# Patient Record
Sex: Male | Born: 1950 | Race: Black or African American | Hispanic: No | Marital: Married | Smoking: Never smoker
Health system: Southern US, Community
[De-identification: ages and names within clinical notes are randomized; demographics above are authoritative.]

## PROBLEM LIST (undated history)

## (undated) DIAGNOSIS — G4752 REM sleep behavior disorder: Secondary | ICD-10-CM

## (undated) DIAGNOSIS — N200 Calculus of kidney: Secondary | ICD-10-CM

## (undated) DIAGNOSIS — G3184 Mild cognitive impairment, so stated: Secondary | ICD-10-CM

## (undated) DIAGNOSIS — M545 Low back pain, unspecified: Secondary | ICD-10-CM

## (undated) DIAGNOSIS — G20 Parkinson's disease: Secondary | ICD-10-CM

## (undated) DIAGNOSIS — F329 Major depressive disorder, single episode, unspecified: Secondary | ICD-10-CM

## (undated) DIAGNOSIS — M1990 Unspecified osteoarthritis, unspecified site: Secondary | ICD-10-CM

## (undated) DIAGNOSIS — R413 Other amnesia: Secondary | ICD-10-CM

## (undated) DIAGNOSIS — E785 Hyperlipidemia, unspecified: Secondary | ICD-10-CM

## (undated) DIAGNOSIS — G20A1 Parkinson's disease without dyskinesia, without mention of fluctuations: Secondary | ICD-10-CM

## (undated) DIAGNOSIS — I10 Essential (primary) hypertension: Secondary | ICD-10-CM

## (undated) DIAGNOSIS — F419 Anxiety disorder, unspecified: Secondary | ICD-10-CM

## (undated) DIAGNOSIS — F32A Depression, unspecified: Secondary | ICD-10-CM

## (undated) DIAGNOSIS — Z87442 Personal history of urinary calculi: Secondary | ICD-10-CM

## (undated) DIAGNOSIS — N201 Calculus of ureter: Secondary | ICD-10-CM

## (undated) DIAGNOSIS — E669 Obesity, unspecified: Secondary | ICD-10-CM

## (undated) DIAGNOSIS — G8929 Other chronic pain: Secondary | ICD-10-CM

## (undated) DIAGNOSIS — E119 Type 2 diabetes mellitus without complications: Secondary | ICD-10-CM

## (undated) DIAGNOSIS — R269 Unspecified abnormalities of gait and mobility: Secondary | ICD-10-CM

## (undated) HISTORY — DX: Low back pain: M54.5

## (undated) HISTORY — DX: REM sleep behavior disorder: G47.52

## (undated) HISTORY — DX: Low back pain, unspecified: M54.50

## (undated) HISTORY — DX: Depression, unspecified: F32.A

## (undated) HISTORY — PX: OTHER SURGICAL HISTORY: SHX169

## (undated) HISTORY — DX: Major depressive disorder, single episode, unspecified: F32.9

## (undated) HISTORY — DX: Other chronic pain: G89.29

## (undated) HISTORY — DX: Anxiety disorder, unspecified: F41.9

## (undated) HISTORY — DX: Calculus of kidney: N20.0

## (undated) HISTORY — DX: Obesity, unspecified: E66.9

## (undated) HISTORY — DX: Other amnesia: R41.3

## (undated) HISTORY — DX: Unspecified abnormalities of gait and mobility: R26.9

---

## 2006-07-18 ENCOUNTER — Ambulatory Visit: Payer: Self-pay | Admitting: Family Medicine

## 2006-07-24 ENCOUNTER — Ambulatory Visit: Payer: Self-pay | Admitting: Family Medicine

## 2006-08-01 ENCOUNTER — Ambulatory Visit: Payer: Self-pay | Admitting: Family Medicine

## 2008-12-12 ENCOUNTER — Ambulatory Visit: Payer: Self-pay | Admitting: Internal Medicine

## 2008-12-12 DIAGNOSIS — F3289 Other specified depressive episodes: Secondary | ICD-10-CM | POA: Insufficient documentation

## 2008-12-12 DIAGNOSIS — M545 Low back pain: Secondary | ICD-10-CM

## 2008-12-12 DIAGNOSIS — Z87442 Personal history of urinary calculi: Secondary | ICD-10-CM

## 2008-12-12 DIAGNOSIS — F329 Major depressive disorder, single episode, unspecified: Secondary | ICD-10-CM

## 2008-12-12 LAB — CONVERTED CEMR LAB
AST: 26 units/L (ref 0–37)
Albumin: 4 g/dL (ref 3.5–5.2)
Alkaline Phosphatase: 63 units/L (ref 39–117)
BUN: 13 mg/dL (ref 6–23)
Bilirubin, Direct: 0.1 mg/dL (ref 0.0–0.3)
Chloride: 106 meq/L (ref 96–112)
Cholesterol: 216 mg/dL (ref 0–200)
Eosinophils Relative: 3.9 % (ref 0.0–5.0)
GFR calc Af Amer: 88 mL/min
GFR calc non Af Amer: 73 mL/min
Glucose, Bld: 96 mg/dL (ref 70–99)
HCT: 44 % (ref 39.0–52.0)
HDL: 35.8 mg/dL — ABNORMAL LOW (ref >39.0)
Monocytes Absolute: 0.6 10*3/uL (ref 0.1–1.0)
Monocytes Relative: 11.8 % (ref 3.0–12.0)
Neutrophils Relative %: 53.2 % (ref 43.0–77.0)
Platelets: 190 10*3/uL (ref 150–400)
Potassium: 4.1 meq/L (ref 3.5–5.1)
RDW: 13.6 % (ref 11.5–14.6)
Sodium: 142 meq/L (ref 135–145)
TSH: 1.73 microintl units/mL (ref 0.35–5.50)
Total CHOL/HDL Ratio: 6
Total Protein: 7.6 g/dL (ref 6.0–8.3)
Triglycerides: 104 mg/dL (ref 0–149)
VLDL: 21 mg/dL (ref 0–40)
WBC: 5 10*3/uL (ref 4.5–10.5)

## 2008-12-26 ENCOUNTER — Telehealth: Payer: Self-pay | Admitting: Internal Medicine

## 2008-12-29 ENCOUNTER — Telehealth: Payer: Self-pay | Admitting: Internal Medicine

## 2009-03-04 ENCOUNTER — Ambulatory Visit: Payer: Self-pay | Admitting: Internal Medicine

## 2009-03-04 DIAGNOSIS — R062 Wheezing: Secondary | ICD-10-CM

## 2009-04-22 ENCOUNTER — Encounter: Admission: RE | Admit: 2009-04-22 | Discharge: 2009-04-22 | Payer: Self-pay | Admitting: Family Medicine

## 2009-05-06 ENCOUNTER — Ambulatory Visit: Payer: Self-pay | Admitting: Internal Medicine

## 2012-02-14 ENCOUNTER — Ambulatory Visit (HOSPITAL_COMMUNITY)
Admission: RE | Admit: 2012-02-14 | Discharge: 2012-02-14 | Disposition: A | Discharge status: Home / Self Care | Payer: BC Managed Care – PPO | Source: Ambulatory Visit | Attending: Chiropractic Medicine | Admitting: Chiropractic Medicine

## 2012-02-14 ENCOUNTER — Other Ambulatory Visit (HOSPITAL_COMMUNITY): Payer: Self-pay | Admitting: Chiropractic Medicine

## 2012-02-14 DIAGNOSIS — M5146 Schmorl's nodes, lumbar region: Secondary | ICD-10-CM | POA: Insufficient documentation

## 2012-02-14 DIAGNOSIS — M47817 Spondylosis without myelopathy or radiculopathy, lumbosacral region: Secondary | ICD-10-CM | POA: Insufficient documentation

## 2012-02-14 DIAGNOSIS — M545 Low back pain: Secondary | ICD-10-CM

## 2012-05-08 ENCOUNTER — Other Ambulatory Visit: Payer: Self-pay | Admitting: Family Medicine

## 2012-05-08 DIAGNOSIS — R5381 Other malaise: Secondary | ICD-10-CM

## 2012-05-09 ENCOUNTER — Other Ambulatory Visit: Payer: BC Managed Care – PPO

## 2012-06-06 ENCOUNTER — Ambulatory Visit
Admission: RE | Admit: 2012-06-06 | Discharge: 2012-06-06 | Disposition: A | Discharge status: Home / Self Care | Payer: BC Managed Care – PPO | Source: Ambulatory Visit | Attending: Family Medicine | Admitting: Family Medicine

## 2012-06-06 DIAGNOSIS — R5383 Other fatigue: Secondary | ICD-10-CM

## 2012-06-06 MED ORDER — IOHEXOL 300 MG/ML  SOLN
75.0000 mL | Freq: Once | INTRAMUSCULAR | Status: AC | PRN
  Administered 2012-06-06: 75 mL via INTRAVENOUS

## 2013-07-08 ENCOUNTER — Encounter (HOSPITAL_COMMUNITY): Payer: Self-pay | Admitting: *Deleted

## 2013-07-08 ENCOUNTER — Emergency Department (HOSPITAL_COMMUNITY): Payer: BC Managed Care – PPO

## 2013-07-08 ENCOUNTER — Emergency Department (HOSPITAL_COMMUNITY)
Admission: EM | Admit: 2013-07-08 | Discharge: 2013-07-08 | Disposition: A | Discharge status: Home / Self Care | Payer: BC Managed Care – PPO | Attending: Emergency Medicine | Admitting: Emergency Medicine

## 2013-07-08 DIAGNOSIS — M545 Low back pain, unspecified: Secondary | ICD-10-CM | POA: Insufficient documentation

## 2013-07-08 DIAGNOSIS — G20A1 Parkinson's disease without dyskinesia, without mention of fluctuations: Secondary | ICD-10-CM | POA: Insufficient documentation

## 2013-07-08 DIAGNOSIS — Z79899 Other long term (current) drug therapy: Secondary | ICD-10-CM | POA: Insufficient documentation

## 2013-07-08 DIAGNOSIS — E119 Type 2 diabetes mellitus without complications: Secondary | ICD-10-CM | POA: Insufficient documentation

## 2013-07-08 DIAGNOSIS — E7800 Pure hypercholesterolemia, unspecified: Secondary | ICD-10-CM | POA: Insufficient documentation

## 2013-07-08 DIAGNOSIS — N200 Calculus of kidney: Secondary | ICD-10-CM | POA: Insufficient documentation

## 2013-07-08 DIAGNOSIS — G20 Parkinson's disease: Secondary | ICD-10-CM | POA: Insufficient documentation

## 2013-07-08 DIAGNOSIS — R110 Nausea: Secondary | ICD-10-CM | POA: Insufficient documentation

## 2013-07-08 DIAGNOSIS — I10 Essential (primary) hypertension: Secondary | ICD-10-CM | POA: Insufficient documentation

## 2013-07-08 HISTORY — DX: Essential (primary) hypertension: I10

## 2013-07-08 HISTORY — DX: Parkinson's disease without dyskinesia, without mention of fluctuations: G20.A1

## 2013-07-08 HISTORY — DX: Parkinson's disease: G20

## 2013-07-08 LAB — CBC WITH DIFFERENTIAL/PLATELET
Basophils Relative: 0 % (ref 0–1)
HCT: 43.4 % (ref 39.0–52.0)
Hemoglobin: 14.8 g/dL (ref 13.0–17.0)
Lymphocytes Relative: 8 % — ABNORMAL LOW (ref 12–46)
Lymphs Abs: 0.9 10*3/uL (ref 0.7–4.0)
Monocytes Absolute: 0.5 10*3/uL (ref 0.1–1.0)
Monocytes Relative: 5 % (ref 3–12)
Neutro Abs: 9.3 10*3/uL — ABNORMAL HIGH (ref 1.7–7.7)
Neutrophils Relative %: 87 % — ABNORMAL HIGH (ref 43–77)
RBC: 4.99 MIL/uL (ref 4.22–5.81)
WBC: 10.7 10*3/uL — ABNORMAL HIGH (ref 4.0–10.5)

## 2013-07-08 LAB — COMPREHENSIVE METABOLIC PANEL
Albumin: 4.2 g/dL (ref 3.5–5.2)
Alkaline Phosphatase: 64 U/L (ref 39–117)
BUN: 14 mg/dL (ref 6–23)
CO2: 24 mEq/L (ref 19–32)
Chloride: 104 mEq/L (ref 96–112)
GFR calc non Af Amer: 45 mL/min — ABNORMAL LOW (ref >90)
Glucose, Bld: 192 mg/dL — ABNORMAL HIGH (ref 70–99)
Potassium: 4.2 mEq/L (ref 3.5–5.1)
Total Bilirubin: 0.4 mg/dL (ref 0.3–1.2)

## 2013-07-08 LAB — URINALYSIS, ROUTINE W REFLEX MICROSCOPIC
Glucose, UA: NEGATIVE mg/dL
Leukocytes, UA: NEGATIVE
Nitrite: NEGATIVE
Specific Gravity, Urine: 1.03 (ref 1.005–1.030)
pH: 6 (ref 5.0–8.0)

## 2013-07-08 LAB — LIPASE, BLOOD: Lipase: 14 U/L (ref 11–59)

## 2013-07-08 MED ORDER — PROMETHAZINE HCL 25 MG PO TABS: 25.0000 mg | ORAL_TABLET | Freq: Four times a day (QID) | ORAL | Status: DC | PRN

## 2013-07-08 MED ORDER — SODIUM CHLORIDE 0.9 % IV SOLN
INTRAVENOUS | Status: DC
  Administered 2013-07-08: 02:00:00 via INTRAVENOUS

## 2013-07-08 MED ORDER — SODIUM CHLORIDE 0.9 % IV BOLUS (SEPSIS): 1000.0000 mL | Freq: Once | INTRAVENOUS | Status: DC

## 2013-07-08 MED ORDER — OXYCODONE-ACETAMINOPHEN 5-325 MG PO TABS: 2.0000 | ORAL_TABLET | ORAL | Status: DC | PRN

## 2013-07-08 MED ORDER — IOHEXOL 300 MG/ML  SOLN
100.0000 mL | Freq: Once | INTRAMUSCULAR | Status: AC | PRN
  Administered 2013-07-08: 100 mL via INTRAVENOUS

## 2013-07-08 MED ORDER — FENTANYL CITRATE 0.05 MG/ML IJ SOLN
50.0000 ug | INTRAMUSCULAR | Status: DC | PRN
  Administered 2013-07-08: 50 ug via INTRAVENOUS
  Filled 2013-07-08: qty 2

## 2013-07-08 MED ORDER — IOHEXOL 300 MG/ML  SOLN
50.0000 mL | Freq: Once | INTRAMUSCULAR | Status: AC | PRN
  Administered 2013-07-08: 50 mL via ORAL

## 2013-07-08 MED ORDER — ONDANSETRON HCL 4 MG/2ML IJ SOLN
4.0000 mg | Freq: Once | INTRAMUSCULAR | Status: AC
  Administered 2013-07-08: 4 mg via INTRAVENOUS
  Filled 2013-07-08: qty 2

## 2013-07-08 NOTE — ED Provider Notes (Signed)
CSN: [id number]     Arrival date & time 07/08/13  0024 History     First MD Initiated Contact with Patient 07/08/13 [phone]     Chief Complaint  Patient presents with  . Flank Pain   (Consider location/radiation/quality/duration/timing/severity/associated sxs/prior Treatment) HPI History provided by patient. Onset a few days ago of some right-sided lower back pain which resolved and today presents with left-sided flank pain. Has associated nausea but no vomiting. No fevers or chills. He has not noticed any hematuria. Patient states history of kidney stones and pain today feels somewhat like that. No sudden onset pain. Pain is moderate in severity and sharp in quality. No chest pain. No shortness of breath. No rash.   Past Medical History  Diagnosis Date  . Hypertension   . High cholesterol   . Diabetes mellitus without complication   . Parkinson's disease    History reviewed. No pertinent past surgical history. History reviewed. No pertinent family history. History  Substance Use Topics  . Smoking status: Never Smoker   . Smokeless tobacco: Not on file  . Alcohol Use: No    Review of Systems  Constitutional: Negative for fever and chills.  HENT: Negative for neck pain and neck stiffness.   Eyes: Negative for pain.  Respiratory: Negative for shortness of breath.   Cardiovascular: Negative for chest pain.  Gastrointestinal: Positive for nausea and abdominal pain. Negative for blood in stool.  Genitourinary: Negative for dysuria.  Musculoskeletal: Negative for back pain.  Skin: Negative for rash.  Neurological: Negative for headaches.  All other systems reviewed and are negative.    Allergies  Review of patient's allergies indicates no known allergies.  Home Medications   Current Outpatient Rx  Name  Route  Sig  Dispense  Refill  . losartan (COZAAR) 50 MG tablet   Oral   Take 50 mg by mouth daily.         Marland Kitchen rOPINIRole (REQUIP XL) 2 MG 24 hr tablet   Oral   Take 6  mg by mouth at bedtime.         . Rosuvastatin Calcium (CRESTOR PO)   Oral   Take 1 tablet by mouth daily.          BP 169/87  Pulse 84  Temp(Src) 98.4 F (36.9 C) (Oral)  Resp 18  SpO2 100% Physical Exam  Constitutional: He is oriented to person, place, and time. He appears well-developed and well-nourished.  HENT:  Head: Normocephalic and atraumatic.  Eyes: EOM are normal. Pupils are equal, round, and reactive to light.  Neck: Neck supple.  Cardiovascular: Normal rate, regular rhythm and intact distal pulses.   Pulmonary/Chest: Effort normal and breath sounds normal. No respiratory distress.  Abdominal: Soft. He exhibits no mass.  Tender left flank and left lower quadrant without rebound or guarding. Decreased bowel sounds  Musculoskeletal: Normal range of motion. He exhibits no edema.  Neurological: He is alert and oriented to person, place, and time.  Skin: Skin is warm and dry.    ED Course   Procedures (including critical care time)  Labs Reviewed  URINALYSIS, ROUTINE W REFLEX MICROSCOPIC - Abnormal; Notable for the following:    APPearance CLOUDY (*)    Hgb urine dipstick LARGE (*)    Ketones, ur 15 (*)    Protein, ur 100 (*)    All other components within normal limits  CBC WITH DIFFERENTIAL - Abnormal; Notable for the following:    WBC 10.7 (*)  Neutrophils Relative % 87 (*)    Neutro Abs 9.3 (*)    Lymphocytes Relative 8 (*)    All other components within normal limits  COMPREHENSIVE METABOLIC PANEL - Abnormal; Notable for the following:    Glucose, Bld 192 (*)    Creatinine, Ser 1.59 (*)    GFR calc non Af Amer 45 (*)    GFR calc Af Amer 52 (*)    All other components within normal limits  URINE MICROSCOPIC-ADD ON - Abnormal; Notable for the following:    Bacteria, UA FEW (*)    All other components within normal limits  LIPASE, BLOOD   Ct Abdomen Pelvis W Contrast  07/08/2013   *RADIOLOGY REPORT*  Clinical Data: Left lower quadrant pain.  CT  ABDOMEN AND PELVIS WITH CONTRAST  Technique:  Multidetector CT imaging of the abdomen and pelvis was performed following the standard protocol during bolus administration of intravenous contrast.  Contrast: OMNIPAQUE IOHEXOL 300 MG/ML  SOLN  Comparison: None.  Findings:  LOWER CHEST:  Mediastinum: RCA atherosclerosis.  No cardiomegaly.  Mild refluxed or retained enteric contrast within the distal esophagus.  Lungs/pleura: No consolidation.  ABDOMEN/PELVIS:  Liver: No focal abnormality.  Biliary: No evidence of biliary obstruction or stone.  Pancreas: Unremarkable.  Spleen: Unremarkable.  Adrenals: Unremarkable.  Kidneys and ureters: 1 cm stone in the proximal left ureter, near the junction with the mid ureter.  There is proximal hydronephrosis with asymmetric left perinephric edema and delayed urinary excretion.  No additional urinary calculi.  Bladder: Unremarkable.  Bowel: No obstruction. Normal appendix. Diffuse colonic diverticulosis.  Retroperitoneum: No mass or adenopathy.  Peritoneum: No free fluid or gas.  Reproductive: Unremarkable.  Vascular: Retroaortic left renal vein.  Aortic and iliac atherosclerosis.  OSSEOUS: No acute abnormalities. Flowing ventral vertebral body osteophytes in the lower thoracic region.  IMPRESSION: 1. 1-cm proximal left ureteral calculus with obstructive uropathy.  2. Colonic diverticulosis.   Original Report Authenticated By: Tiburcio Pea   IV fluids. IV Zofran. IV fentanyl.  3:57 AM pain improved. PT feels comfortable with plan discharge home, follow up Urology. He agrees to strict return precautions. Rx provided.   MDM  Left flank pain with large kidney stone as above Pain improved with IV narcotics Labs and urinalysis reviewed - significant hematuria CT scan reviewed and results shared with patient Plan discharge home with close urology follow-up Vital signs and the nurse's notes reviewed and considered  Sunnie Nielsen, MD 07/09/13 [phone]

## 2013-07-08 NOTE — ED Notes (Signed)
Pt stats that he is having left flank pain. Pt states that he has had kidney stones in the past and that this feels different but that he has noticed abnormalities to his urine.

## 2013-07-12 ENCOUNTER — Encounter (HOSPITAL_BASED_OUTPATIENT_CLINIC_OR_DEPARTMENT_OTHER): Payer: Self-pay | Admitting: *Deleted

## 2013-07-12 ENCOUNTER — Other Ambulatory Visit: Payer: Self-pay | Admitting: Urology

## 2013-07-16 NOTE — Progress Notes (Signed)
PER PT WIFE , GLADYS. HE HAS APPOINTMENT AT [facility] FOR SECOND OPINION. AND SHE STATED THAT THERE WAS A CERTAIN PROCEDURE HE WANTED DONE AND NOT DONE . THEY HAD NOT HEARD FROM THE OFFICE TO CONFIRM UNTIL I CALLED. IT WAS LEFT THAT I/ WE ARE TO CALL BACK ON Thursday 07-18-2013 AT 1630 TO SEE WHAT THERE DECISION IS .

## 2013-07-22 ENCOUNTER — Ambulatory Visit (HOSPITAL_BASED_OUTPATIENT_CLINIC_OR_DEPARTMENT_OTHER): Admission: RE | Admit: 2013-07-22 | Payer: BC Managed Care – PPO | Source: Ambulatory Visit | Admitting: Urology

## 2013-07-22 ENCOUNTER — Encounter (HOSPITAL_BASED_OUTPATIENT_CLINIC_OR_DEPARTMENT_OTHER): Admission: RE | Payer: Self-pay | Source: Ambulatory Visit

## 2013-07-22 HISTORY — DX: Hyperlipidemia, unspecified: E78.5

## 2013-07-22 HISTORY — DX: Calculus of ureter: N20.1

## 2013-07-22 HISTORY — DX: Type 2 diabetes mellitus without complications: E11.9

## 2013-07-22 HISTORY — DX: Personal history of urinary calculi: Z87.442

## 2013-07-22 SURGERY — CYSTOURETEROSCOPY, WITH RETROGRADE PYELOGRAM AND STENT INSERTION
Anesthesia: General | Laterality: Left

## 2014-01-24 ENCOUNTER — Ambulatory Visit: Payer: Self-pay | Admitting: Neurology

## 2014-02-24 ENCOUNTER — Other Ambulatory Visit: Payer: Self-pay | Admitting: Neurology

## 2014-04-14 ENCOUNTER — Encounter: Payer: Self-pay | Admitting: Adult Health

## 2014-04-14 ENCOUNTER — Encounter: Payer: Self-pay | Admitting: *Deleted

## 2014-04-14 ENCOUNTER — Encounter: Payer: Self-pay | Admitting: Neurology

## 2014-04-15 ENCOUNTER — Encounter (INDEPENDENT_AMBULATORY_CARE_PROVIDER_SITE_OTHER): Payer: Self-pay

## 2014-04-15 ENCOUNTER — Ambulatory Visit (INDEPENDENT_AMBULATORY_CARE_PROVIDER_SITE_OTHER): Payer: BC Managed Care – PPO | Admitting: Adult Health

## 2014-04-15 ENCOUNTER — Encounter: Payer: Self-pay | Admitting: Adult Health

## 2014-04-15 VITALS — BP 134/91 | HR 66 | Wt 250.0 lb

## 2014-04-15 DIAGNOSIS — G20 Parkinson's disease: Secondary | ICD-10-CM

## 2014-04-15 MED ORDER — SELEGILINE HCL 5 MG PO CAPS: ORAL_CAPSULE | ORAL | Status: DC

## 2014-04-15 NOTE — Patient Instructions (Signed)
Parkinson Disease Parkinson disease is a disorder of the brain and spinal cord (central nervous system). The person will slowly lose the ability to control his or her body movements. This happens due to:  Damaged nerve cells.  Low levels of a certain brain chemical. HOME CARE  Exercise often.  Make time to rest during the day.  Take all medicine as told by your doctor.  Replace buttons and zippers with elastic and Velcro if getting dressed is difficult.  Put grab bars or rails in your home. This helps you to not fall.  Go to speech therapy or therapy to help you with daily activities (occupational therapy). Do this as told by your doctor.  Keep all doctor visits as told. GET HELP IF:  Your medicine does not help your symptoms.  You fall.  You have trouble swallowing or choke on your food. MAKE SURE YOU:  Understand these instructions.  Will watch your condition.  Will get help right away if you are not doing well or get worse. Document Released: 02/06/2012 Document Revised: 03/11/2013 Document Reviewed: 02/06/2012 [employer] Patient Information 2014 [employer].  

## 2014-04-15 NOTE — Progress Notes (Signed)
PATIENT: Jesse Escobar DOB: 10-20-1951  REASON FOR VISIT: follow up HISTORY FROM: patient  HISTORY OF PRESENT ILLNESS: Jesse Escobar is a 63 year old right handed black male with a history of Parkinson's disease. He returns today for a follow-up visit.The patient is currently taking selegiline. He primarily has problems with his left leg. Feels that the left leg and now the left arm will freeze up. He has a hard time putting his sock on the left foot. He notices that he has some trouble writing and turning pages with the left arm/hand. He notices that he has an intermittent mild tremor in the left arm. Denies having falls but has stumbled. Denies difficulty with swallowing. Patient states that three weeks ago he was working on his car and starting hallucinating. He thought he saw a snapping turtle in his front yard. He states that the hallucinations occur 2-3 times a week. He is not frighten nor agitated by hallucinations. He states that he has dealt with it so long that he has learned to live with it. The patient states that he sleeps well but he often gets up 2-3 times per night to use the bathroom. Wife states that he does talk in his sleep. Wife states that the patient is always fatigued. If she asks him to go somewhere he has to force himself to do it. No new medical issues since the last visit.   REVIEW OF SYSTEMS: Full 14 system review of systems performed and notable only for:  Constitutional: fatigue, fever excessive sweating Eyes: eye discharge, eye itching, eye pain, blurred vision Ear/Nose/Throat: facial swelling, hearing loss, ringing in ears, drooling Skin: moles Cardiovascular: palpitations Respiratory: N/A  Gastrointestinal: constipation  Genitourinary: incontinence of bladder, frequency of urination, urgency Hematology/Lymphatic: N/A  Endocrine: cold and heat intolerance Musculoskeletal: joint pain, back pain, aching muscles, walking difficulty, neck pain, neck  stiffness Allergy/Immunology: N/A  Neurological: memory loss, dizziness, numbness, weakness, facial drooling  Psychiatric: confusion sometimes, decreased concentration, depression, nervous/anxious, hallucinations Sleep: frequent waking, snoring, sleep talking   ALLERGIES: No Known Allergies  HOME MEDICATIONS: Outpatient Prescriptions Prior to Visit  Medication Sig Dispense Refill  . losartan (COZAAR) 50 MG tablet Take 50 mg by mouth daily.      Marland Kitchen. rOPINIRole (REQUIP XL) 2 MG 24 hr tablet Take 6 mg by mouth at bedtime.      . Rosuvastatin Calcium (CRESTOR PO) Take 1 tablet by mouth daily.      . selegiline (ELDEPRYL) 5 MG capsule TAKE ONE CAPSULE BY MOUTH EVERY MORNING  30 capsule  4  . oxyCODONE-acetaminophen (PERCOCET/ROXICET) 5-325 MG per tablet Take 2 tablets by mouth every 4 (four) hours as needed for pain.  15 tablet  0  . promethazine (PHENERGAN) 25 MG tablet Take 1 tablet (25 mg total) by mouth every 6 (six) hours as needed for nausea.  30 tablet  0   No facility-administered medications prior to visit.    PAST MEDICAL HISTORY: Past Medical History  Diagnosis Date  . Hypertension   . Parkinson's disease   . Type 2 diabetes mellitus   . Hyperlipidemia   . Left ureteral calculus   . History of kidney stones   . Anxiety and depression   . Obesity   . Chronic low back pain   . REM sleep behavior disorder     PAST SURGICAL HISTORY: No past surgical history on file.  FAMILY HISTORY: Family History  Problem Relation Age of Onset  . Stroke Father   .  Diabetes      SOCIAL HISTORY: History   Social History  . Marital Status: Married    Spouse Name: Venita SheffieldGladys    Number of Children: 3  . Years of Education: 16   Occupational History  .     Social History Main Topics  . Smoking status: Never Smoker   . Smokeless tobacco: Never Used  . Alcohol Use: No  . Drug Use: No  . Sexual Activity: Not on file   Other Topics Concern  . Not on file   Social History  Narrative   Patient is married Venita Sheffield(Gladys) and lives at home with his wife and children.   Patient has a college education.   Patient is right-handed.   Patient drinks very little caffeine.               PHYSICAL EXAM  Filed Vitals:   04/15/14 1118  BP: 134/91  Pulse: 66  Weight: 250 lb (113.399 kg)   Body mass index is 36.9 kg/(m^2).  Generalized: Well developed, in no acute distress   Neurological examination  Mentation: Alert oriented to time, place, history taking. Follows all commands speech and language fluent. Masking of face.  Cranial nerve II-XII: . Extraocular movements were full, visual field were full on confrontational test.  Motor: The motor testing reveals 5 over 5 strength of all 4 extremities. Good symmetric motor tone is noted throughout.  Sensory: Sensory testing is intact to soft touch on all 4 extremities. No evidence of extinction is noted.  Coordination: Cerebellar testing reveals good finger-nose-finger and heel-to-shin bilaterally.  Gait and station: Gait is normal occasionally will drag the left foot. Decreased arm swing on the left side. Tandem gait is normal. Romberg is negative. No drift is seen.  Reflexes: Deep tendon reflexes are symmetric and normal bilaterally.    DIAGNOSTIC DATA (LABS, IMAGING, TESTING) - I reviewed patient records, labs, notes, testing and imaging myself where available.  Lab Results  Component Value Date   WBC 10.7* 07/08/2013   HGB 14.8 07/08/2013   HCT 43.4 07/08/2013   MCV 87.0 07/08/2013   PLT 195 07/08/2013      Component Value Date/Time   NA 141 07/08/2013 0031   K 4.2 07/08/2013 0031   CL 104 07/08/2013 0031   CO2 24 07/08/2013 0031   GLUCOSE 192* 07/08/2013 0031   BUN 14 07/08/2013 0031   CREATININE 1.59* 07/08/2013 0031   CALCIUM 9.7 07/08/2013 0031   PROT 7.9 07/08/2013 0031   ALBUMIN 4.2 07/08/2013 0031   AST 23 07/08/2013 0031   ALT 22 07/08/2013 0031   ALKPHOS 64 07/08/2013 0031   BILITOT 0.4 07/08/2013 0031    GFRNONAA 45* 07/08/2013 0031   GFRAA 52* 07/08/2013 0031   Lab Results  Component Value Date   CHOL 216* 12/12/2008   HDL 35.8* 12/12/2008   LDLDIRECT 161.3 12/12/2008   TRIG 104 12/12/2008   CHOLHDL 6.0 CALC 12/12/2008     Lab Results  Component Value Date   TSH 1.73 12/12/2008      ASSESSMENT AND PLAN 63 y.o. year old male  has a past medical history of Hypertension; Parkinson's disease; Type 2 diabetes mellitus; Hyperlipidemia; Left ureteral calculus; History of kidney stones; Anxiety and depression; Obesity; Chronic low back pain; and REM sleep behavior disorder. here with:  1. Paralysis agitans  Patient states the left leg is continuing to freeze up and now he has noticed it in the left arm. Some days are worse than others.  He continues to have hallucinations but they do not frighten him or cause agitation. Patient also feels very fatigued, often not motivated to do certain things. I discussed this patient with Dr. Anne Hahn and he examined the patient as well. At this time we will increase the selegiline to 5 mg in the morning and again at noon. If patient cannot tolerate the increase in medication he was advised to notify the office. He will follow up in 3 months with Dr. Anne Hahn.    Butch Penny, MSN, NP-C 04/15/2014, 11:25 AM [facility] [address] [place], [place] [address] [phone]  Note: This document was prepared with digital dictation and possible smart phrase technology. Any transcriptional errors that result from this process are unintentional.

## 2014-05-07 ENCOUNTER — Ambulatory Visit: Payer: BC Managed Care – PPO | Attending: Family Medicine | Admitting: Physical Therapy

## 2014-05-07 DIAGNOSIS — G20 Parkinson's disease: Secondary | ICD-10-CM | POA: Insufficient documentation

## 2014-05-07 DIAGNOSIS — G20A1 Parkinson's disease without dyskinesia, without mention of fluctuations: Secondary | ICD-10-CM | POA: Insufficient documentation

## 2014-05-07 DIAGNOSIS — IMO0001 Reserved for inherently not codable concepts without codable children: Secondary | ICD-10-CM | POA: Insufficient documentation

## 2014-05-07 DIAGNOSIS — R269 Unspecified abnormalities of gait and mobility: Secondary | ICD-10-CM | POA: Diagnosis not present

## 2014-05-07 DIAGNOSIS — M545 Low back pain, unspecified: Secondary | ICD-10-CM | POA: Insufficient documentation

## 2014-05-20 ENCOUNTER — Ambulatory Visit: Payer: BC Managed Care – PPO | Admitting: Physical Therapy

## 2014-05-20 DIAGNOSIS — IMO0001 Reserved for inherently not codable concepts without codable children: Secondary | ICD-10-CM | POA: Diagnosis not present

## 2014-05-27 ENCOUNTER — Ambulatory Visit: Payer: BC Managed Care – PPO | Admitting: Physical Therapy

## 2014-05-27 DIAGNOSIS — IMO0001 Reserved for inherently not codable concepts without codable children: Secondary | ICD-10-CM | POA: Diagnosis not present

## 2014-06-03 ENCOUNTER — Ambulatory Visit: Payer: BC Managed Care – PPO | Attending: Family Medicine | Admitting: Physical Therapy

## 2014-06-03 DIAGNOSIS — G20A1 Parkinson's disease without dyskinesia, without mention of fluctuations: Secondary | ICD-10-CM | POA: Insufficient documentation

## 2014-06-03 DIAGNOSIS — G20 Parkinson's disease: Secondary | ICD-10-CM | POA: Diagnosis not present

## 2014-06-03 DIAGNOSIS — M545 Low back pain, unspecified: Secondary | ICD-10-CM | POA: Diagnosis not present

## 2014-06-03 DIAGNOSIS — R269 Unspecified abnormalities of gait and mobility: Secondary | ICD-10-CM | POA: Diagnosis not present

## 2014-06-03 DIAGNOSIS — IMO0001 Reserved for inherently not codable concepts without codable children: Secondary | ICD-10-CM | POA: Diagnosis present

## 2014-06-10 ENCOUNTER — Ambulatory Visit: Payer: BC Managed Care – PPO | Admitting: Physical Therapy

## 2014-06-16 ENCOUNTER — Ambulatory Visit: Payer: BC Managed Care – PPO | Admitting: Physical Therapy

## 2014-06-16 DIAGNOSIS — IMO0001 Reserved for inherently not codable concepts without codable children: Secondary | ICD-10-CM | POA: Diagnosis not present

## 2014-07-14 ENCOUNTER — Ambulatory Visit: Payer: Self-pay | Admitting: Neurology

## 2014-07-17 ENCOUNTER — Encounter: Payer: Self-pay | Admitting: Neurology

## 2014-07-17 ENCOUNTER — Ambulatory Visit (INDEPENDENT_AMBULATORY_CARE_PROVIDER_SITE_OTHER): Payer: BC Managed Care – PPO | Admitting: Neurology

## 2014-07-17 VITALS — BP 139/82 | HR 64 | Wt 252.0 lb

## 2014-07-17 DIAGNOSIS — R413 Other amnesia: Secondary | ICD-10-CM

## 2014-07-17 DIAGNOSIS — G20 Parkinson's disease: Secondary | ICD-10-CM

## 2014-07-17 HISTORY — DX: Other amnesia: R41.3

## 2014-07-17 NOTE — Patient Instructions (Signed)
Parkinson Disease Parkinson disease is a disorder of the central nervous system, which includes the brain and spinal cord. A person with this disease slowly loses the ability to completely control body movements. Within the brain, there is a group of nerve cells (basal ganglia) that help control movement. The basal ganglia are damaged and do not work properly in a person with Parkinson disease. In addition, the basal ganglia produce and use a brain chemical called dopamine. The dopamine chemical sends messages to other parts of the body to control and coordinate body movements. Dopamine levels are low in a person with Parkinson disease. If the dopamine levels are low, then the body does not receive the correct messages it needs to move normally.  CAUSES  The exact reason why the basal ganglia get damaged is not known. Some medical researchers have thought that infection, genes, environment, and certain medicines may contribute to the cause.  SYMPTOMS   An early symptom of Parkinson disease is often an uncontrolled shaking (tremor) of the hands. The tremor will often disappear when the affected hand is consciously used.  As the disease progresses, walking, talking, getting out of a chair, and new movements become more difficult.  Muscles get stiff and movements become slower.  Balance and coordination become harder.  Depression, trouble swallowing, urinary problems, constipation, and sleep problems can occur.  Later in the disease, memory and thought processes may deteriorate. DIAGNOSIS  There are no specific tests to diagnose Parkinson disease. You may be referred to a neurologist for evaluation. Your caregiver will ask about your medical history, symptoms, and perform a physical exam. Blood tests and imaging tests of your brain may be performed to rule out other diseases. The imaging tests may include an MRI or a CT scan. TREATMENT  The goal of treatment is to relieve symptoms. Medicines may be  prescribed once the symptoms become troublesome. Medicine will not stop the progression of the disease, but medicine can make movement and balance better and help control tremors. Speech and occupational therapy may also be prescribed. Sometimes, surgical treatment of the brain can be done in young people. HOME CARE INSTRUCTIONS  Get regular exercise and rest periods during the day to help prevent exhaustion and depression.  If getting dressed becomes difficult, replace buttons and zippers with Velcro and elastic on your clothing.  Take all medicine as directed by your caregiver.  Install grab bars or railings in your home to prevent falls.  Go to speech or occupational therapy as directed.  Keep all follow-up visits as directed by your caregiver. SEEK MEDICAL CARE IF:  Your symptoms are not controlled with your medicine.  You fall.  You have trouble swallowing or choke on your food. MAKE SURE YOU:  Understand these instructions.  Will watch your condition.  Will get help right away if you are not doing well or get worse. Document Released: 11/11/2000 Document Revised: 03/11/2013 Document Reviewed: 12/14/2011 [employer] Patient Information 2015 [employer]. This information is not intended to replace advice given to you by your health care provider. Make sure you discuss any questions you have with your health care provider.  

## 2014-07-17 NOTE — Progress Notes (Signed)
Reason for visit: Parkinson's disease  Jesse Escobar is an 63 y.o. male  History of present illness:  [name] is a 63 year old right-handed black male with a history of Parkinson's disease. The patient has had slow progression of his disease over the last several years, and he currently is on selegiline taking 5 mg in the evening. The patient was to go on 5 mg twice daily, but he has never taken the medication in this fashion. The patient in the past could not tolerate Requip secondary to nausea. The patient has had some problems with hallucinations in the past, but this has not been an issue recently. The patient is on disability for his Parkinson's disease. 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 The patient has fallen on one occasion since last seen. He denies any significant tremor. He denies problems with swallowing.  Past Medical History  Diagnosis Date  . Hypertension   . Parkinson's disease   . Type 2 diabetes mellitus   . Hyperlipidemia   . Left ureteral calculus   . History of kidney stones   . Anxiety and depression   . Obesity   . Chronic low back pain   . REM sleep behavior disorder   . Kidney stones   . Memory difficulties 07/17/2014    History reviewed. No pertinent past surgical history.  Family History  Problem Relation Age of Onset  . Stroke Father   . Diabetes    . Hypertension Sister   . Hypertension Brother   . Hypertension Sister   . Hypertension Sister   . Hypertension Brother   . Parkinson's disease Brother   . Hypertension Brother   . Hypertension Brother   . Hypertension Brother     Social history:  reports that he has never smoked. He has never used smokeless tobacco. He reports that he does not drink alcohol or use illicit drugs.   No Known Allergies  Medications:  Current Outpatient Prescriptions on File Prior to Visit  Medication Sig Dispense  Refill  . losartan (COZAAR) 50 MG tablet Take 50 mg by mouth daily.      . ONE TOUCH ULTRA TEST test strip       . Rosuvastatin Calcium (CRESTOR PO) Take 1 tablet by mouth daily.      . selegiline (ELDEPRYL) 5 MG capsule Take one tablet in the morning and again at noon.  60 capsule  4   No current facility-administered medications on file prior to visit.    ROS:  Out of a complete 14 system review of symptoms, the patient complains only of the following symptoms, and all other reviewed systems are negative.  Activity change, excessive sweating Facial swelling, ringing in the ears, drooling Eye discharge, eye redness, light sensitivity, eye pain, blurred vision Heat intolerance Snoring, sleep talking Incontinence of bladder, frequency of urination, urgency Back pain, walking difficulty Moles Memory loss, speech difficulty, weakness, tremors, facial drooping Depression, anxiety, hallucinations  Blood pressure 139/82, pulse 64, weight 252 lb (114.306 kg).  Physical Exam  General: The patient is alert and cooperative at the time of the examination. The patient is moderately obese.  Skin: No significant peripheral edema is noted.   Neurologic Exam  Mental status: The Mini-Mental status examination done today shows a total score 24/30.  Cranial nerves: Facial symmetry is present. Speech is normal, no aphasia or dysarthria is noted. Extraocular movements are full.  Visual fields are full. The patient has masking of the face.  Motor: The patient has good strength in all 4 extremities.  Sensory examination: Soft touch sensation is symmetric on the face, arms, and legs.  Coordination: The patient has good finger-nose-finger and heel-to-shin bilaterally.  Gait and station: The patient has a normal gait, with exception that there is decreased arm swing bilaterally. Tandem gait is normal. Romberg is negative. No drift is seen.  Reflexes: Deep tendon reflexes are  symmetric.   Assessment/Plan:  One. Parkinson's disease  2. Memory disturbance  The patient has relatively good mobility at this time, but the patient's wife indicates that there have been some cognitive issues. The patient is to go back on selegiline 5 mg in the morning, and 5 mg at noon. He will followup in about 5 months. In the future, the patient may need to go on low-dose Sinemet.  Marlan Palau MD 07/17/2014 1:23 PM  [facility] [address] [place], [place] [address]  Phone [phone] Fax [phone]

## 2014-09-02 ENCOUNTER — Ambulatory Visit: Payer: Self-pay | Admitting: Neurology

## 2014-10-15 ENCOUNTER — Encounter: Payer: Self-pay | Admitting: Neurology

## 2014-10-21 ENCOUNTER — Encounter: Payer: Self-pay | Admitting: Neurology

## 2014-12-18 ENCOUNTER — Encounter: Payer: Self-pay | Admitting: Neurology

## 2014-12-18 ENCOUNTER — Ambulatory Visit (INDEPENDENT_AMBULATORY_CARE_PROVIDER_SITE_OTHER): Payer: PPO | Admitting: Neurology

## 2014-12-18 VITALS — BP 118/74 | HR 69 | Ht 69.0 in | Wt 250.2 lb

## 2014-12-18 DIAGNOSIS — G20 Parkinson's disease: Secondary | ICD-10-CM

## 2014-12-18 DIAGNOSIS — R413 Other amnesia: Secondary | ICD-10-CM

## 2014-12-18 MED ORDER — CARBIDOPA-LEVODOPA 25-100 MG PO TABS: 0.5000 | ORAL_TABLET | Freq: Two times a day (BID) | ORAL | Status: DC

## 2014-12-18 NOTE — Progress Notes (Signed)
Reason for visit: Parkinson's disease  Jesse Escobar is an 64 y.o. male  History of present illness:  [name] is a 64 year old right-handed black male with a history of Parkinson's disease. He is on selegiline, but he never takes more than one 5 mg tablet daily. Indicates that the medication is too expensive for him, but then states that he only pays $4 per prescription. He states that there has not been much change in his functional abilities. He has had one fall since last seen. The patient indicates his memory issues have been stable, they have not progressed. He is eating and drinking well, he sleeps well at night. He may have occasional hallucinations, seeing bugs flying about in the bathroom at night when he uses the restroom. The patient returns for an evaluation.  Past Medical History  Diagnosis Date  . Hypertension   . Parkinson's disease   . Type 2 diabetes mellitus   . Hyperlipidemia   . Left ureteral calculus   . History of kidney stones   . Anxiety and depression   . Obesity   . Chronic low back pain   . REM sleep behavior disorder   . Kidney stones   . Memory difficulties 07/17/2014    History reviewed. No pertinent past surgical history.  Family History  Problem Relation Age of Onset  . Stroke Father   . Diabetes    . Hypertension Sister   . Hypertension Brother   . Hypertension Sister   . Hypertension Sister   . Hypertension Brother   . Parkinson's disease Brother   . Hypertension Brother   . Hypertension Brother   . Hypertension Brother     Social history:  reports that he has never smoked. He has never used smokeless tobacco. He reports that he does not drink alcohol or use illicit drugs.   No Known Allergies  Medications:  Current Outpatient Prescriptions on File Prior to Visit  Medication Sig Dispense Refill  . losartan (COZAAR) 50 MG tablet Take 50 mg by mouth daily.    . metFORMIN (GLUCOPHAGE) 500 MG tablet Take 500 mg by mouth daily.     . ONE TOUCH ULTRA TEST test strip     . Rosuvastatin Calcium (CRESTOR PO) Take 1 tablet by mouth daily.    . selegiline (ELDEPRYL) 5 MG capsule Take one tablet in the morning and again at noon. 60 capsule 4   No current facility-administered medications on file prior to visit.    ROS:  Out of a complete 14 system review of symptoms, the patient complains only of the following symptoms, and all other reviewed systems are negative.  Hearing loss, ringing in the ears, drooling Blurred vision Snoring, sleep talking Frequency of urination Back pain, walking difficulty Memory loss, weakness, tremors, facial drooping Confusion, hallucinations  Blood pressure 118/74, pulse 69, height 5\' 9"  (1.753 m), weight 250 lb 3.2 oz (113.49 kg).  Physical Exam  General: The patient is alert and cooperative at the time of the examination. The patient is moderately to markedly obese.  Skin: No significant peripheral edema is noted.   Neurologic Exam  Mental status: The patient is oriented x 3. The Mini-Mental status examination done today shows a total score 30/30.  Cranial nerves: Facial symmetry is present. Speech is normal, no aphasia or dysarthria is noted. Extraocular movements are full. Visual fields are full. Masking of the face is seen.  Motor: The patient has good strength in all 4 extremities.  Sensory  examination: Soft touch sensation reveals a slight decrease in sensation on the left face, arms, and legs.  Coordination: The patient has good finger-nose-finger and heel-to-shin bilaterally.  Gait and station: The patient has the ability to walk independently, there is decreased arm swing bilaterally. No tremors are seen. Tandem gait is minimally unsteady.. Romberg is negative. No drift is seen.  Reflexes: Deep tendon reflexes are symmetric.   Assessment/Plan:  1. Parkinson's disease  2. Memory disorder  The patient has done better on the Mini-Mental Status Examination today.  We will follow this over time, consider medications for memory if this seems to decline. The patient will be placed on Sinemet taking 25/100 mg tablets one half tablet twice daily. He was encouraged to go back up to taking selegiline 1 the morning, and one at noon. He will follow-up in 4-5 months.  Marlan Palau MD 12/19/2014 9:56 AM  [facility] [address] [place], [place] [address]  Phone [phone] Fax [phone]

## 2014-12-18 NOTE — Patient Instructions (Addendum)
  Try to go back to taking selegiline 5 mg twice daily. Take 1 in the morning, and one at noon. We will start Sinemet (carbidopa) at the 25/100 mg tablet, take one half tablet twice daily.  Parkinson Disease Parkinson disease is a disorder of the central nervous system, which includes the brain and spinal cord. A person with this disease slowly loses the ability to completely control body movements. Within the brain, there is a group of nerve cells (basal ganglia) that help control movement. The basal ganglia are damaged and do not work properly in a person with Parkinson disease. In addition, the basal ganglia produce and use a brain chemical called dopamine. The dopamine chemical sends messages to other parts of the body to control and coordinate body movements. Dopamine levels are low in a person with Parkinson disease. If the dopamine levels are low, then the body does not receive the correct messages it needs to move normally.  CAUSES  The exact reason why the basal ganglia get damaged is not known. Some medical researchers have thought that infection, genes, environment, and certain medicines may contribute to the cause.  SYMPTOMS   An early symptom of Parkinson disease is often an uncontrolled shaking (tremor) of the hands. The tremor will often disappear when the affected hand is consciously used.  As the disease progresses, walking, talking, getting out of a chair, and new movements become more difficult.  Muscles get stiff and movements become slower.  Balance and coordination become harder.  Depression, trouble swallowing, urinary problems, constipation, and sleep problems can occur.  Later in the disease, memory and thought processes may deteriorate. DIAGNOSIS  There are no specific tests to diagnose Parkinson disease. You may be referred to a neurologist for evaluation. Your caregiver will ask about your medical history, symptoms, and perform a physical exam. Blood tests and imaging  tests of your brain may be performed to rule out other diseases. The imaging tests may include an MRI or a CT scan. TREATMENT  The goal of treatment is to relieve symptoms. Medicines may be prescribed once the symptoms become troublesome. Medicine will not stop the progression of the disease, but medicine can make movement and balance better and help control tremors. Speech and occupational therapy may also be prescribed. Sometimes, surgical treatment of the brain can be done in young people. HOME CARE INSTRUCTIONS  Get regular exercise and rest periods during the day to help prevent exhaustion and depression.  If getting dressed becomes difficult, replace buttons and zippers with Velcro and elastic on your clothing.  Take all medicine as directed by your caregiver.  Install grab bars or railings in your home to prevent falls.  Go to speech or occupational therapy as directed.  Keep all follow-up visits as directed by your caregiver. SEEK MEDICAL CARE IF:  Your symptoms are not controlled with your medicine.  You fall.  You have trouble swallowing or choke on your food. MAKE SURE YOU:  Understand these instructions.  Will watch your condition.  Will get help right away if you are not doing well or get worse. Document Released: 11/11/2000 Document Revised: 03/11/2013 Document Reviewed: 12/14/2011 [facility] Patient Information 2015 [employer], [place]. This information is not intended to replace advice given to you by your health care provider. Make sure you discuss any questions you have with your health care provider.

## 2014-12-31 ENCOUNTER — Other Ambulatory Visit: Payer: Self-pay | Admitting: Neurology

## 2014-12-31 NOTE — Telephone Encounter (Signed)
Last OV note says: Try to go back to taking selegiline 5 mg twice daily. Take 1 in the morning, and one at noon.

## 2015-01-06 ENCOUNTER — Telehealth: Payer: Self-pay | Admitting: *Deleted

## 2015-01-06 NOTE — Telephone Encounter (Signed)
Form,[employer] sent to Nurse Lupita Leashonna and Dr Anne HahnWillis to be completed 01-06-15.

## 2015-01-07 DIAGNOSIS — Z0289 Encounter for other administrative examinations: Secondary | ICD-10-CM

## 2015-01-15 NOTE — Telephone Encounter (Signed)
Patient wife calling wanting to know if form is ready. Advised patient the turn around time is 7-14 business days. Patient wife is wanting this done so they can have there insurance premium waived. Please call.

## 2015-01-16 NOTE — Telephone Encounter (Signed)
Spoke to wife and will have it completed by Tuesday and signed.

## 2015-01-22 NOTE — Telephone Encounter (Signed)
Form is completed and given to medical records. 

## 2015-01-22 NOTE — Telephone Encounter (Signed)
Form,[employer] received,completed by Dr Anne HahnWillis and Nurse Lupita Leashonna at front desk for patient 01/22/15.

## 2015-02-03 ENCOUNTER — Ambulatory Visit: Payer: PPO | Attending: Family Medicine | Admitting: Physical Therapy

## 2015-02-03 DIAGNOSIS — R258 Other abnormal involuntary movements: Secondary | ICD-10-CM | POA: Diagnosis present

## 2015-02-03 DIAGNOSIS — R269 Unspecified abnormalities of gait and mobility: Secondary | ICD-10-CM | POA: Diagnosis not present

## 2015-02-03 DIAGNOSIS — R293 Abnormal posture: Secondary | ICD-10-CM | POA: Insufficient documentation

## 2015-02-04 NOTE — Therapy (Signed)
[facility] [address] [place], [place], [address] Phone: [phone]   Fax:  [phone]  Physical Therapy Evaluation  Patient Details  Name: Matyas Baisley MRN: [id number] Date of Birth: 30-May-1951 Referring Provider:  Maurice Small, MD  Encounter Date: 02/03/2015      PT End of Session - 02/04/15 1254    Visit Number 1   Number of Visits 9   PT Start Time 0934   PT Stop Time 1015   PT Time Calculation (min) 41 min   Activity Tolerance Patient tolerated treatment well      Past Medical History  Diagnosis Date  . Hypertension   . Parkinson's disease   . Type 2 diabetes mellitus   . Hyperlipidemia   . Left ureteral calculus   . History of kidney stones   . Anxiety and depression   . Obesity   . Chronic low back pain   . REM sleep behavior disorder   . Kidney stones   . Memory difficulties 07/17/2014    No past surgical history on file.  There were no vitals taken for this visit.  Visit Diagnosis:  Bradykinesia  Abnormality of gait  Posture abnormality      Subjective Assessment - 02/03/15 0937    Symptoms Pt is a 64 year old male who presents to OP PT with Parkinson's disease.  MD recommends that I come back and take more therapy.  Pt reports that he is noticing increased slowness with mobility in the past few weeks.  He reports one fall in the past 6-9 monhts.  He stepped off lawnmower and he fell going backwards.     Patient Stated Goals Pt's goal for therapy is to get stronger and move better.  He is interested in cycling to help with PD symptoms.   Currently in Pain? No/denies          [facility] PT Assessment - 02/03/15 0944    Assessment   Medical Diagnosis Parkinson's disease  L side more affected   Precautions   Precautions Fall  Avoids lifting heavy objects due to back pain   Balance Screen   Has the patient fallen in the past 6 months Yes   How many times? 1  Reports stumbling  occasionally, but able to catch balance   Has the patient had a decrease in activity level because of a fear of falling?  Yes  Doesn't hunt anymore   Is the patient reluctant to leave their home because of a fear of falling?  No   Home Environment   Living Enviornment Private residence   Living Arrangements Spouse/significant other   Available Help at Discharge Family   Type of Home House   Home Access Stairs to enter   Entrance Stairs-Number of Steps 3   Entrance Stairs-Rails Can reach both   Home Layout Two level   Alternate Level Stairs-Number of Steps --  flight of steps   Alternate Level Stairs-Rails Right   Home Equipment None   Prior Function   Level of Independence Independent with basic ADLs;Independent with homemaking with ambulation;Independent with gait;Independent with transfers   Vocation Retired;On disability   Leisure Helps with granddaughter activities, feeds dogs outside   ROM / Strength   AROM / PROM / Strength Strength;PROM   PROM   Overall PROM Comments increased stiffness through ROM in LLE   Strength   Overall Strength Comments grossly tested 4/5 throughout LEs; 3+/5 in L quads and hamstrings  Transfers   Transfers Sit to Stand;Stand to Sit   Sit to Stand From chair/3-in-1;Five times sit to stand;6: Modified independent (Device/Increase time);Without upper extremity assist  5x sit<>stand 18.38 sec   Stand to Sit 6: Modified independent (Device/Increase time);Without upper extremity assist;To chair/3-in-1   Ambulation/Gait   Ambulation/Gait Yes   Ambulation/Gait Assistance 7: Independent   Ambulation Distance (Feet) 150 Feet   Assistive device None   Gait Pattern Decreased step length - left;Decreased stance time - left;Decreased stride length;Decreased dorsiflexion - left;Shuffle;Trunk flexed;Narrow base of support  decr. foot clearance, decr. arm swing forward flexed   Ambulation Surface Level;Indoor   Gait velocity 10.14 sec=3.23 ft/sec   Gait  velocity - backwards 23.03 sec in 20 ft=0.87 ft/sec   Stairs Yes   Stairs Assistance 6: Modified independent (Device/Increase time)   Stair Management Technique Alternating pattern;One rail Right   Number of Stairs 4   Height of Stairs 6   Standardized Balance Assessment   Standardized Balance Assessment Timed Up and Go Test   Timed Up and Go Test   TUG Normal TUG;Manual TUG;Cognitive TUG   Normal TUG (seconds) 13.42   Manual TUG (seconds) 14.48   Cognitive TUG (seconds) 14.65   Functional Gait  Assessment   Gait assessed  Yes   Gait Level Surface Walks 20 ft in less than 7 sec but greater than 5.5 sec, uses assistive device, slower speed, mild gait deviations, or deviates 6-10 in outside of the 12 in walkway width.  6.23 sec   Change in Gait Speed Able to smoothly change walking speed without loss of balance or gait deviation. Deviate no more than 6 in outside of the 12 in walkway width.   Gait with Horizontal Head Turns Performs head turns smoothly with no change in gait. Deviates no more than 6 in outside 12 in walkway width   Gait with Vertical Head Turns Performs task with slight change in gait velocity (eg, minor disruption to smooth gait path), deviates 6 - 10 in outside 12 in walkway width or uses assistive device   Gait and Pivot Turn Pivot turns safely in greater than 3 sec and stops with no loss of balance, or pivot turns safely within 3 sec and stops with mild imbalance, requires small steps to catch balance.   Step Over Obstacle Is able to step over one shoe box (4.5 in total height) but must slow down and adjust steps to clear box safely. May require verbal cueing.   Gait with Narrow Base of Support Ambulates less than 4 steps heel to toe or cannot perform without assistance.   Gait with Eyes Closed Walks 20 ft, slow speed, abnormal gait pattern, evidence for imbalance, deviates 10-15 in outside 12 in walkway width. Requires more than 9 sec to ambulate 20 ft.  10.57 sec    Ambulating Backwards Walks 20 ft, slow speed, abnormal gait pattern, evidence for imbalance, deviates 10-15 in outside 12 in walkway width.  23.03 sec in 20 ft.   Steps Alternating feet, must use rail.   Total Score 17                               PT Long Term Goals - 02/04/15 1302    PT LONG TERM GOAL #1   Title Pt will be independent with HEP for improved transfers, gait and balance.   Time 4   Period Weeks   Status New  PT LONG TERM GOAL #2   Title Pt will improve Functional Gait Assessment to at least 20/30 for decreased fall risk.   Time 4   Period Weeks   Status New   PT LONG TERM GOAL #3   Title Pt will improve 5x sit<>stand to less than or equal to 15 seconds for improved transfer safety and efficiency.   Time 4   Period Weeks   Status New   PT LONG TERM GOAL #4   Title Pt will verbalize understanding of local Parkinson's resources.   Time 4   Period Weeks   Status New   PT LONG TERM GOAL #5   Title Pt will verbalize plans for continued community fitness upon D/C from PT.   Time 4   Period Weeks   Status New               Plan - 02-24-2015 1257    Clinical Impression Statement Pt is a 64 year old male who presents to OP PT with Parkinson's disease with recent slowed functional mobility and fall.  Pt presents with forward flexed posture, bradykinesia, decreased timing and coordination of gait, decreased balance.  Pt is at fall risk per Functional Gait Assessment  and slowed transfers.   Pt will benefit from skilled therapeutic intervention in order to improve on the following deficits Abnormal gait;Decreased activity tolerance;Decreased balance;Decreased mobility;Difficulty walking;Decreased strength;Postural dysfunction   Rehab Potential Good   PT Frequency 2x / week   PT Duration 4 weeks  plus eval   PT Treatment/Interventions ADLs/Self Care Home Management;Gait training;Stair training;Functional mobility training;Neuromuscular  re-education;Balance training;Therapeutic exercise;Therapeutic activities;Patient/family education          G-Codes - 02/24/15 1308    Functional Assessment Tool Used 5x sit<>stand 18.38 seconds, Functional Gait Assessment 17/30, backwards gait velocity 0.87 ft/sec   Functional Limitation Mobility: Walking and moving around   Mobility: Walking and Moving Around Current Status [phone]) At least 20 percent but less than 40 percent impaired, limited or restricted   Mobility: Walking and Moving Around Goal Status [phone]) At least 1 percent but less than 20 percent impaired, limited or restricted       Problem List Patient Active Problem List   Diagnosis Date Noted  . Memory difficulties 07/17/2014  . Paralysis agitans 04/15/2014  . WHEEZING 03/04/2009  . DEPRESSION 12/12/2008  . LOW BACK PAIN 12/12/2008  . NEPHROLITHIASIS, HX OF 12/12/2008    Camelia Stelzner W. 02-24-2015, 1:11 PM  Lonia Blood, PT 24-Feb-2015 1:11 PM Phone: [phone] Fax: [phone]   [facility] [place] [facility] [address] [place], [place], [address] Phone: [phone]   Fax:  [phone]

## 2015-02-12 ENCOUNTER — Ambulatory Visit: Payer: PPO | Admitting: Physical Therapy

## 2015-02-17 ENCOUNTER — Ambulatory Visit: Payer: PPO | Admitting: Physical Therapy

## 2015-02-17 DIAGNOSIS — R258 Other abnormal involuntary movements: Secondary | ICD-10-CM | POA: Diagnosis not present

## 2015-02-17 DIAGNOSIS — R269 Unspecified abnormalities of gait and mobility: Secondary | ICD-10-CM

## 2015-02-17 NOTE — Patient Instructions (Signed)
Sit to Stand Transfers:  1. Scoot out to the edge of the chair 2. Place your feet flat on the floor, shoulder width apart.  Make sure your feet are tucked just under your knees. 3. Lean forward (nose over toes) with momentum, and stand up tall with your best posture.  If you need to use your arms, use them as a quick boost up to stand. 4. If you are in a low or soft chair, you can lean back and then forward up to stand, in order to get more momentum. 5. Once you are standing, make sure you are looking ahead and standing tall.  To sit down:  1. Back up until you feel the chair behind your legs. 2. Bend at you hips, reaching  Back for you chair, if needed, then slowly squat to sit down on your chair. 

## 2015-02-18 NOTE — Therapy (Signed)
[facility] [address] [place], [place], [address] Phone: [phone]   Fax:  [phone]  Physical Therapy Treatment  Patient Details  Name: Jesse Escobar MRN: [id number] Date of Birth: December 18, 1950 Referring Provider:  Maurice SmallGriffin, Elaine, MD  Encounter Date: 02/17/2015      PT End of Session - 02/18/15 1314    Visit Number 2   Number of Visits 9   PT Start Time 1149   PT Stop Time 1230   PT Time Calculation (min) 41 min   Activity Tolerance Patient tolerated treatment well      Past Medical History  Diagnosis Date  . Hypertension   . Parkinson's disease   . Type 2 diabetes mellitus   . Hyperlipidemia   . Left ureteral calculus   . History of kidney stones   . Anxiety and depression   . Obesity   . Chronic low back pain   . REM sleep behavior disorder   . Kidney stones   . Memory difficulties 07/17/2014    No past surgical history on file.  There were no vitals filed for this visit.  Visit Diagnosis:  Bradykinesia  Abnormality of gait      Subjective Assessment - 02/17/15 1153    Symptoms Getting up from low, soft surfaces is harder; takes me a long time to bathe and dress.  PT discusses possibility of OT with patient and he does not want to pursue at this time.   Currently in Pain? No/denies                       [facility] Adult PT Treatment/Exercise - 02/17/15 1204    Transfers   Transfers Sit to Stand;Stand to Sit   Sit to Stand 5: Supervision;From elevated surface;With upper extremity assist;Without upper extremity assist;From bed;From chair/3-in-1  20", 18", 16", then 14" soft surface x 5-10 reps each   Sit to Stand Details (indicate cue type and reason) cues for scooting, increased forward lean, upright posture to stand   Stand to Sit 5: Supervision;With upper extremity assist;Without upper extremity assist;To elevated surface;To chair/3-in-1   Ambulation/Gait    Ambulation/Gait Yes   Ambulation/Gait Assistance 6: Modified independent (Device/Increase time)   Ambulation Distance (Feet) 400 Feet  then 14920ft x 2   Assistive device None  400 ft with walking poles to facilitate arm swing   Gait Pattern Decreased step length - left;Decreased stance time - left;Decreased stride length;Decreased dorsiflexion - left;Shuffle;Trunk flexed;Narrow base of support   Ambulation Surface Level;Indoor   Gait Comments PT provided cues for upright posture with gait as well as cues for large amplitude movement patterns for improved foot clearance and to widen BOS      Seated PWR! Moves exercises:  PWR! Up for posture x 20, PWR! Rock for weightshifting x 20, PWR! Twist x 20, PWR! Step x 20, with verbal and visual cues for technique, weightshifting through hips and for improved large amplitude of movement pattern.  With gait, pt has decreased foot clearance on LLE with therapist providing cues for increased foot clearance and increased heelstrike.          PT Education - 02/18/15 1312    Education provided Yes   Education Details Sit<>stand transfer technique; pt reports having pictures of exercises from previous bout of therapy; PT advised pt to bring his pictures next visit   Person(s) Educated Patient   Methods Explanation;Demonstration;Handout   Comprehension Verbalized understanding;Returned demonstration  PT Long Term Goals - 02/04/15 1302    PT LONG TERM GOAL #1   Title Pt will be independent with HEP for improved transfers, gait and balance.   Time 4   Period Weeks   Status New   PT LONG TERM GOAL #2   Title Pt will improve Functional Gait Assessment to at least 20/30 for decreased fall risk.   Time 4   Period Weeks   Status New   PT LONG TERM GOAL #3   Title Pt will improve 5x sit<>stand to less than or equal to 15 seconds for improved transfer safety and efficiency.   Time 4   Period Weeks   Status New   PT LONG TERM GOAL #4    Title Pt will verbalize understanding of local Parkinson's resources.   Time 4   Period Weeks   Status New   PT LONG TERM GOAL #5   Title Pt will verbalize plans for continued community fitness upon D/C from PT.   Time 4   Period Weeks   Status New               Plan - 02/18/15 1314    Clinical Impression Statement Initiated HEP/functional mobility activities with transfer training from varied height surfaces.  Pt would benefit from PWR! Moves exercises and functional mobility/gait activities for improved movement pattern and sequencing with increased large amplitude of movements.     Pt will benefit from skilled therapeutic intervention in order to improve on the following deficits Abnormal gait;Decreased activity tolerance;Decreased balance;Decreased mobility;Difficulty walking;Decreased strength;Postural dysfunction   Rehab Potential Good   PT Frequency 2x / week   PT Duration 4 weeks  wk 1 of 4   PT Treatment/Interventions ADLs/Self Care Home Management;Gait training;Stair training;Functional mobility training;Neuromuscular re-education;Balance training;Therapeutic exercise;Therapeutic activities;Patient/family education   PT Next Visit Plan review sit<>stand varied heights; pt to bring in HEP from previous therapy-to review and likely add seated and standing PWR!; work on gait on treadmill   Consulted and Agree with Plan of Care Patient        Problem List Patient Active Problem List   Diagnosis Date Noted  . Memory difficulties 07/17/2014  . Paralysis agitans 04/15/2014  . WHEEZING 03/04/2009  . DEPRESSION 12/12/2008  . LOW BACK PAIN 12/12/2008  . NEPHROLITHIASIS, HX OF 12/12/2008    Kseniya Grunden W. 02/18/2015, 1:19 PM  Lonia Blood, PT 02/18/2015 1:21 PM Phone: [phone] Fax: [phone]  [facility] [address] [place], [place], [address] Phone: [phone]   Fax:   [phone]

## 2015-02-25 ENCOUNTER — Ambulatory Visit: Payer: PPO | Admitting: Physical Therapy

## 2015-02-25 ENCOUNTER — Encounter: Payer: Self-pay | Admitting: Physical Therapy

## 2015-02-25 DIAGNOSIS — R258 Other abnormal involuntary movements: Secondary | ICD-10-CM

## 2015-02-25 NOTE — Therapy (Signed)
[facility] [address] [place], [place], [address] Phone: [phone]   Fax:  [phone]  Physical Therapy Treatment  Patient Details  Name: Jesse Escobar MRN: [id number] Date of Birth: Jan 13, 1951 Referring Provider:  Maurice Small, MD  Encounter Date: 02/25/2015      PT End of Session - 02/25/15 1250    Visit Number 3   Number of Visits 9   PT Start Time 1150   PT Stop Time 1232   PT Time Calculation (min) 42 min   Activity Tolerance Patient tolerated treatment well   Behavior During Therapy Flat affect      Past Medical History  Diagnosis Date  . Hypertension   . Parkinson's disease   . Type 2 diabetes mellitus   . Hyperlipidemia   . Left ureteral calculus   . History of kidney stones   . Anxiety and depression   . Obesity   . Chronic low back pain   . REM sleep behavior disorder   . Kidney stones   . Memory difficulties 07/17/2014    History reviewed. No pertinent past surgical history.  There were no vitals filed for this visit.  Visit Diagnosis:  Bradykinesia      Subjective Assessment - 02/25/15 1158    Symptoms Denies falls or changes since last visit.  [facility]   Patient Stated Goals Pt's goal for therapy is to get stronger and move better.  He is interested in cycling to help with PD symptoms.   Currently in Pain? No/denies                       [facility] Adult PT Treatment/Exercise - 02/25/15 1245    Knee/Hip Exercises: Aerobic   Stationary Bike Scifit level 1.5 all 4 extremities x 9 minutes with bout of increased intensity (up to 105) but pt quickly reverts back to 60 rpm without cues.  Difficulty maintaining consistent rpm.           [facility] [employer]) - 02/25/15 1246    PWR! exercises Moves in sitting;Moves in standing   PWR! Up 20   PWR! Rock 20   PWR! Twist 20   PWR Step 20   Comments standing at counter for UE support;max cues for  intensity   PWR! Step Through Forward/Back step forward x 15 then step back x 15 working on weight shift;counter for UE support and max cues   PWR! Up 20   PWR! Rock 20   PWR! Twist 20   PWR! Step 20   Comments max cues for intensity             PT Education - 02/25/15 1249    Education provided Yes   Education Details HEP from previous therapy still appropriate   Person(s) Educated Patient   Methods Explanation;Demonstration;Handout   Comprehension Verbalized understanding;Verbal cues required;Need further instruction             PT Long Term Goals - 02/04/15 1302    PT LONG TERM GOAL #1   Title Pt will be independent with HEP for improved transfers, gait and balance.   Time 4   Period Weeks   Status New   PT LONG TERM GOAL #2   Title Pt will improve Functional Gait Assessment to at least 20/30 for decreased fall risk.   Time 4   Period Weeks   Status New   PT LONG  TERM GOAL #3   Title Pt will improve 5x sit<>stand to less than or equal to 15 seconds for improved transfer safety and efficiency.   Time 4   Period Weeks   Status New   PT LONG TERM GOAL #4   Title Pt will verbalize understanding of local Parkinson's resources.   Time 4   Period Weeks   Status New   PT LONG TERM GOAL #5   Title Pt will verbalize plans for continued community fitness upon D/C from PT.   Time 4   Period Weeks   Status New               Plan - 02/25/15 1251    Clinical Impression Statement Pt states he has not been performing his HEP issues from previous bout of therapy.  Max cues needed for intensity with all mobility.  Continue PT per POC.   Pt will benefit from skilled therapeutic intervention in order to improve on the following deficits Abnormal gait;Decreased activity tolerance;Decreased balance;Decreased mobility;Difficulty walking;Decreased strength;Postural dysfunction   Rehab Potential Good   PT Frequency 2x / week   PT Duration 4 weeks   PT  Treatment/Interventions ADLs/Self Care Home Management;Gait training;Stair training;Functional mobility training;Neuromuscular re-education;Balance training;Therapeutic exercise;Therapeutic activities;Patient/family education   PT Next Visit Plan Discuss importance of taking Sinemet and Selegiline as directed (not sure pt is taking properly/consistently).  Sit to stand from various heights.  Treadmill for gait.   Consulted and Agree with Plan of Care Patient        Problem List Patient Active Problem List   Diagnosis Date Noted  . Memory difficulties 07/17/2014  . Paralysis agitans 04/15/2014  . WHEEZING 03/04/2009  . DEPRESSION 12/12/2008  . LOW BACK PAIN 12/12/2008  . NEPHROLITHIASIS, HX OF 12/12/2008    Newell CoralRobertson, Denise Terry 02/25/2015, 12:55 PM  [facility] [address] [place], [place], [address] Phone: [phone]   Fax:  [phone]     Newell CoralDenise Terry Robertson, [place] [facility] 02/25/2015 12:55 PM Phone: [phone] Fax: [phone]

## 2015-03-03 ENCOUNTER — Ambulatory Visit: Payer: PPO | Attending: Family Medicine | Admitting: Physical Therapy

## 2015-03-03 VITALS — BP 151/90 | HR 78

## 2015-03-03 DIAGNOSIS — R293 Abnormal posture: Secondary | ICD-10-CM

## 2015-03-03 DIAGNOSIS — R269 Unspecified abnormalities of gait and mobility: Secondary | ICD-10-CM | POA: Insufficient documentation

## 2015-03-03 DIAGNOSIS — R258 Other abnormal involuntary movements: Secondary | ICD-10-CM

## 2015-03-03 NOTE — Therapy (Signed)
[facility] [address] [place], [place], [address] Phone: [phone]   Fax:  [phone]  Physical Therapy Treatment  Patient Details  Name: Jesse Escobar MRN: [id number] Date of Birth: 1951-07-11 Referring Provider:  Maurice SmallGriffin, Elaine, MD  Encounter Date: 03/03/2015      PT End of Session - 03/03/15 1116    Visit Number 4   Number of Visits 9   PT Start Time 1022   PT Stop Time 1100   PT Time Calculation (min) 38 min   Activity Tolerance Patient tolerated treatment well   Behavior During Therapy Flat affect  Reports maybe he's anxious before therapy; that's the only time he gets the unbalanced feeling, as he's coming into therapy      Past Medical History  Diagnosis Date  . Hypertension   . Parkinson's disease   . Type 2 diabetes mellitus   . Hyperlipidemia   . Left ureteral calculus   . History of kidney stones   . Anxiety and depression   . Obesity   . Chronic low back pain   . REM sleep behavior disorder   . Kidney stones   . Memory difficulties 07/17/2014    No past surgical history on file.  Filed Vitals:   03/03/15 1032 03/03/15 1036  BP: 159/95 151/90  Pulse: 78     Visit Diagnosis:  Bradykinesia  Posture abnormality      Subjective Assessment - 03/03/15 1025    Subjective Feel a bit "drunk" in the mornings after taking my medications and coming here.  Just feel off balance.   Currently in Pain? No/denies      Checked pt's blood pressure, with pt not reporting any further symptoms other than to say he mainly feels unsteady when coming into therapy.  Pt questions whether he may be anxious when coming into therapy.  PT discussed letting physician know if anxiety is new or increasing.                    [facility] - 03/03/15 1050    PWR! exercises Moves in standing;Functional moves   PWR! Up 20   PWR! Rock 20   PWR! Twist 20   PWR Step 20  forward step, side step,  back step and weightshift 20 each   Comments standing beside counter, with therapist demo exercises for visual cues; verbal cues for increased intensity of movement   PWR! Sit to Stand 10 reps from varied height surfaces 4 reps with cues for upright posture and large amplitude movements     PWR! Moves performed for improved posture, weightshifting, trunk rotation flexibility, and step and weight shift for initial transition step.  Pt c/o slight back pain and fatigue after standing PWR! [address] [employer] Twist.  Performed modified quadruped anterior/posterior weightshifting at counter, with pt reporting decreased>no pain in low back following 10 reps of slow, controlled weight shifts with gentle low back /hamstring stretch.  With PWR! Sit to Stand, pt requires cues for foot placement, increased forward lean and upright posture upon standing.             PT Long Term Goals - 02/04/15 1302    PT LONG TERM GOAL #1   Title Pt will be independent with HEP for improved transfers, gait and balance.   Time 4   Period Weeks   Status New   PT LONG TERM GOAL #2   Title Pt will improve Functional Gait Assessment  to at least 20/30 for decreased fall risk.   Time 4   Period Weeks   Status New   PT LONG TERM GOAL #3   Title Pt will improve 5x sit<>stand to less than or equal to 15 seconds for improved transfer safety and efficiency.   Time 4   Period Weeks   Status New   PT LONG TERM GOAL #4   Title Pt will verbalize understanding of local Parkinson's resources.   Time 4   Period Weeks   Status New   PT LONG TERM GOAL #5   Title Pt will verbalize plans for continued community fitness upon D/C from PT.   Time 4   Period Weeks   Status New               Plan - 03/03/15 1116    Clinical Impression Statement Pt continues to need max cues for intensity of movement patterns in standing and with sit<>stand.  Pt will continue to benefit from further skilled PT to address gait, posture,  balance and functional stretngth.   Rehab Potential Good   PT Frequency 2x / week   PT Duration 4 weeks   PT Treatment/Interventions ADLs/Self Care Home Management;Gait training;Stair training;Functional mobility training;Neuromuscular re-education;Balance training;Therapeutic exercise;Therapeutic activities;Patient/family education   PT Next Visit Plan Discuss importance of taking Sinemet and Selegiline as directed (not sure pt is taking properly/consistently).  Sit to stand from various heights.  Treadmill for gait; add forward and back step and weight shift; counter balance exercises   Consulted and Agree with Plan of Care Patient        Problem List Patient Active Problem List   Diagnosis Date Noted  . Memory difficulties 07/17/2014  . Paralysis agitans 04/15/2014  . WHEEZING 03/04/2009  . DEPRESSION 12/12/2008  . LOW BACK PAIN 12/12/2008  . NEPHROLITHIASIS, HX OF 12/12/2008    Arath Kaigler W. 03/03/2015, 11:20 AM  Lonia Blood, PT 03/03/2015 11:25 AM Phone: [phone] Fax: [phone]   [facility] [address] [place], [place], [address] Phone: [phone]   Fax:  [phone]

## 2015-03-10 ENCOUNTER — Ambulatory Visit: Payer: PPO | Admitting: Physical Therapy

## 2015-05-28 ENCOUNTER — Ambulatory Visit (INDEPENDENT_AMBULATORY_CARE_PROVIDER_SITE_OTHER): Payer: PPO | Admitting: Neurology

## 2015-05-28 ENCOUNTER — Encounter: Payer: Self-pay | Admitting: Neurology

## 2015-05-28 VITALS — BP 112/75 | HR 83 | Ht 68.0 in | Wt 252.4 lb

## 2015-05-28 DIAGNOSIS — G20 Parkinson's disease: Secondary | ICD-10-CM

## 2015-05-28 DIAGNOSIS — R413 Other amnesia: Secondary | ICD-10-CM | POA: Diagnosis not present

## 2015-05-28 MED ORDER — CARBIDOPA-LEVODOPA ER 25-100 MG PO TBCR: 1.0000 | EXTENDED_RELEASE_TABLET | Freq: Two times a day (BID) | ORAL | Status: DC

## 2015-05-28 NOTE — Patient Instructions (Signed)
Parkinson Disease Parkinson disease is a disorder of the central nervous system, which includes the brain and spinal cord. A person with this disease slowly loses the ability to completely control body movements. Within the brain, there is a group of nerve cells (basal ganglia) that help control movement. The basal ganglia are damaged and do not work properly in a person with Parkinson disease. In addition, the basal ganglia produce and use a brain chemical called dopamine. The dopamine chemical sends messages to other parts of the body to control and coordinate body movements. Dopamine levels are low in a person with Parkinson disease. If the dopamine levels are low, then the body does not receive the correct messages it needs to move normally.  CAUSES  The exact reason why the basal ganglia get damaged is not known. Some medical researchers have thought that infection, genes, environment, and certain medicines may contribute to the cause.  SYMPTOMS   An early symptom of Parkinson disease is often an uncontrolled shaking (tremor) of the hands. The tremor will often disappear when the affected hand is consciously used.  As the disease progresses, walking, talking, getting out of a chair, and new movements become more difficult.  Muscles get stiff and movements become slower.  Balance and coordination become harder.  Depression, trouble swallowing, urinary problems, constipation, and sleep problems can occur.  Later in the disease, memory and thought processes may deteriorate. DIAGNOSIS  There are no specific tests to diagnose Parkinson disease. You may be referred to a neurologist for evaluation. Your caregiver will ask about your medical history, symptoms, and perform a physical exam. Blood tests and imaging tests of your brain may be performed to rule out other diseases. The imaging tests may include an MRI or a CT scan. TREATMENT  The goal of treatment is to relieve symptoms. Medicines may be  prescribed once the symptoms become troublesome. Medicine will not stop the progression of the disease, but medicine can make movement and balance better and help control tremors. Speech and occupational therapy may also be prescribed. Sometimes, surgical treatment of the brain can be done in young people. HOME CARE INSTRUCTIONS  Get regular exercise and rest periods during the day to help prevent exhaustion and depression.  If getting dressed becomes difficult, replace buttons and zippers with Velcro and elastic on your clothing.  Take all medicine as directed by your caregiver.  Install grab bars or railings in your home to prevent falls.  Go to speech or occupational therapy as directed.  Keep all follow-up visits as directed by your caregiver. SEEK MEDICAL CARE IF:  Your symptoms are not controlled with your medicine.  You fall.  You have trouble swallowing or choke on your food. MAKE SURE YOU:  Understand these instructions.  Will watch your condition.  Will get help right away if you are not doing well or get worse. Document Released: 11/11/2000 Document Revised: 03/11/2013 Document Reviewed: 12/14/2011 [employer] Patient Information 2015 [employer]. This information is not intended to replace advice given to you by your health care provider. Make sure you discuss any questions you have with your health care provider.  

## 2015-05-28 NOTE — Progress Notes (Signed)
Reason for visit: Parkinson's disease  Jesse Escobar is an 64 y.o. male  History of present illness:  [name] is a 64 year old right-handed black male with a history of Parkinson's disease and a memory problem. The patient continues to have issues with medical noncompliance. He continues to take selegiline only once a day, not twice a day. He was placed on Sinemet, but he stopped the medication as he had some difficulty breaking the tablets in half. He does walk on a regular basis. He is not having as much issues with confusion or hallucinations. He did fall on 2 occasions since last seen. He is having increasing problems with bradykinesia. He indicates that he sleeps fairly well at night. He denies any issues with swallowing or choking. He indicates that his memory has been relatively stable over time. He returns to this office for an evaluation. The patient does indicate some occasional tremors with the arms.  Past Medical History  Diagnosis Date  . Hypertension   . Parkinson's disease   . Type 2 diabetes mellitus   . Hyperlipidemia   . Left ureteral calculus   . History of kidney stones   . Anxiety and depression   . Obesity   . Chronic low back pain   . REM sleep behavior disorder   . Kidney stones   . Memory difficulties 07/17/2014    History reviewed. No pertinent past surgical history.  Family History  Problem Relation Age of Onset  . Stroke Father   . Diabetes    . Hypertension Sister   . Hypertension Brother   . Hypertension Sister   . Hypertension Sister   . Hypertension Brother   . Parkinson's disease Brother   . Hypertension Brother   . Hypertension Brother   . Hypertension Brother     Social history:  reports that he has never smoked. He has never used smokeless tobacco. He reports that he does not drink alcohol or use illicit drugs.   No Known Allergies  Medications:  Prior to Admission medications   Medication Sig Start Date End Date Taking?  Authorizing Provider  Ascorbic Acid (VITAMIN C) 100 MG tablet Take 100 mg by mouth daily.   Yes Historical Provider, MD  losartan (COZAAR) 50 MG tablet Take 50 mg by mouth daily.   Yes Historical Provider, MD  metFORMIN (GLUCOPHAGE) 500 MG tablet Take 500 mg by mouth daily. 05/21/14  Yes Historical Provider, MD  ONE TOUCH ULTRA TEST test strip  02/17/14  Yes Historical Provider, MD  Rosuvastatin Calcium (CRESTOR PO) Take 1 tablet by mouth daily.   Yes Historical Provider, MD  selegiline (ELDEPRYL) 5 MG capsule Take 1 capsule (5 mg total) by mouth 2 (two) times daily. One in the morning and one at noon 12/31/14  Yes York Spanielharles K Willis, MD  thiamine (VITAMIN B-1) 100 MG tablet Take 100 mg by mouth daily.   Yes Historical Provider, MD    ROS:  Out of a complete 14 system review of symptoms, the patient complains only of the following symptoms, and all other reviewed systems are negative.  Fatigue Hearing loss, ringing in the ears, runny nose, drooling Eye discharge, eye redness, double vision, eye pain Cold intolerance Constipation Restless legs, insomnia, snoring, sleep talking Back pain, walking difficulty Dizziness, weakness, tremors, facial drooping  Blood pressure 112/75, pulse 83, height 5\' 8"  (1.727 m), weight 252 lb 6.4 oz (114.488 kg).  Physical Exam  General: The patient is alert and cooperative at  the time of the examination.  Skin: No significant peripheral edema is noted.   Neurologic Exam  Mental status: The patient is alert and oriented x 3 at the time of the examination. The patient has apparent normal recent and remote memory, with an apparently normal attention span and concentration ability. Mini-Mental Status Examination done today shows a total score of 29/30. The patient is able to name 18 animals in 30 seconds.   Cranial nerves: Facial symmetry is present. Speech is normal, no aphasia or dysarthria is noted. Extraocular movements are full. Visual fields are  full.  Motor: The patient has good strength in all 4 extremities.  Sensory examination: Soft touch sensation is symmetric on the face, arms, and legs.  Coordination: The patient has good finger-nose-finger and heel-to-shin bilaterally.  Gait and station: The patient has a normal gait. Tandem gait is normal. Romberg is negative. No drift is seen.  Reflexes: Deep tendon reflexes are symmetric.   Assessment/Plan:  1. Parkinson's disease  2. Mild memory disturbance  The patient is having ongoing issues taking his medications properly. Once again, I have indicated that he is to take the selegiline in the morning and at noon. We will retry the Sinemet taking the 25/100 mg CR tablets 1 twice daily. He will follow-up in 4 months. He is to contact our office if he is not tolerating the medication. We will continue to follow the memory issues.  Marlan Palau MD 05/28/2015 7:54 PM  [facility] [address] [place], [place] [address]  Phone [phone] Fax [phone]

## 2015-07-10 IMAGING — CT CT ABD-PELV W/ CM
2 of 5 series · 17 of 46 positions shown, 19 images · IV contrast (CONTRAST)
Comparison: None.

CLINICAL DATA: Left lower quadrant pain.

CT ABDOMEN AND PELVIS WITH CONTRAST
TECHNIQUE: Multidetector CT imaging of the abdomen and pelvis was
performed following the standard protocol during bolus
administration of intravenous contrast.
Contrast: 100mL OMNIPAQUE IOHEXOL 300 MG/ML  SOLN

[Series 3: routine · axial · 0.76mm/px · z∈[-796,-371]mm · 14 of 97 slices shown, 16 images]
[im 6/97  soft-tissue]
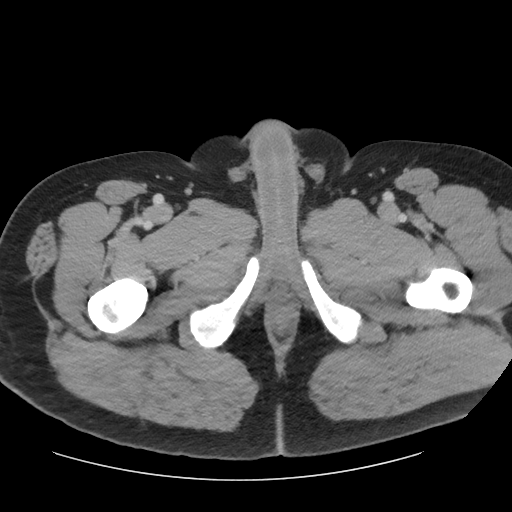
[im 6/97  bone]
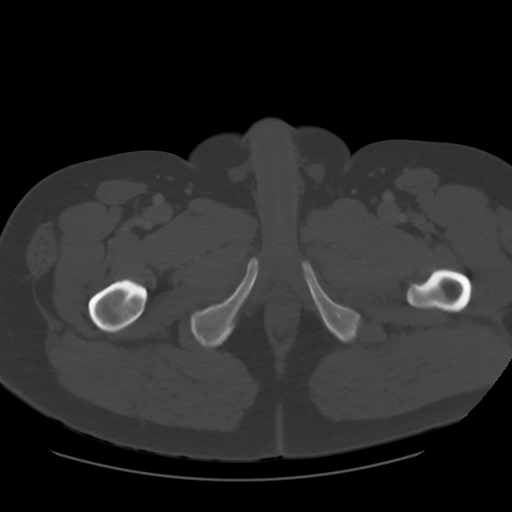
[im 12/97  soft-tissue]
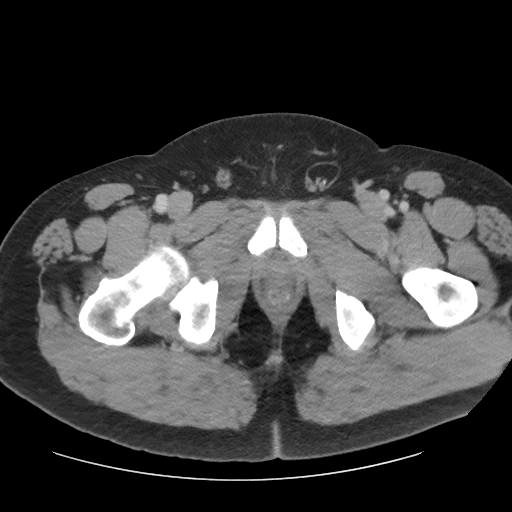
[im 17/97  soft-tissue]
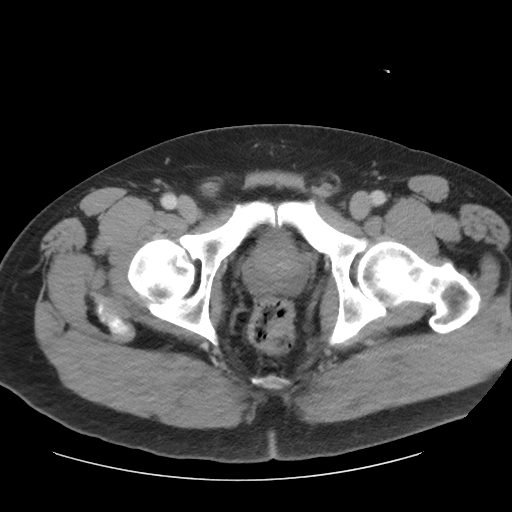
[im 29/97  soft-tissue]
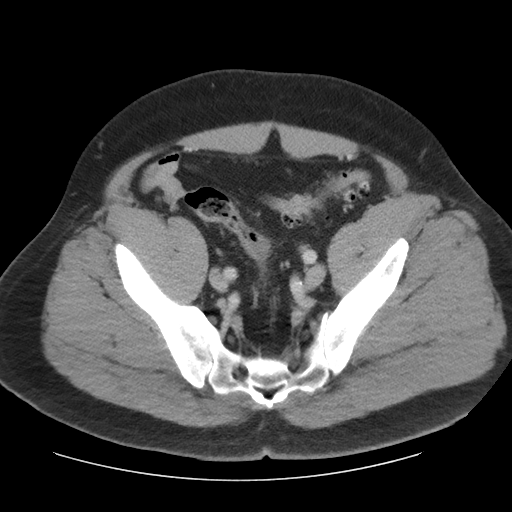
[im 34/97  soft-tissue]
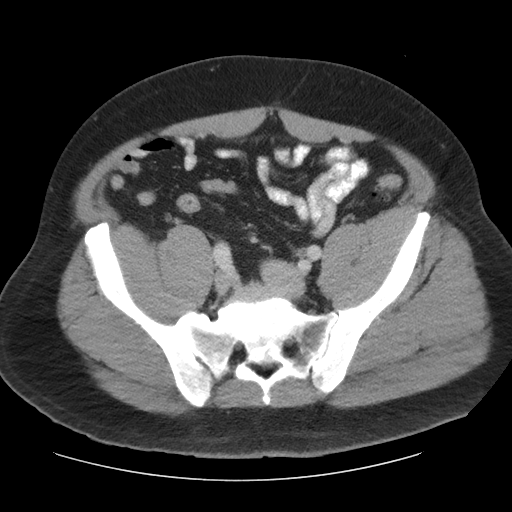
[im 40/97  soft-tissue]
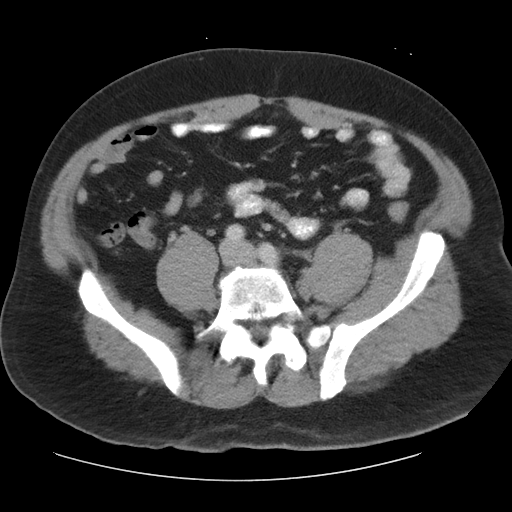
[im 46/97  soft-tissue]
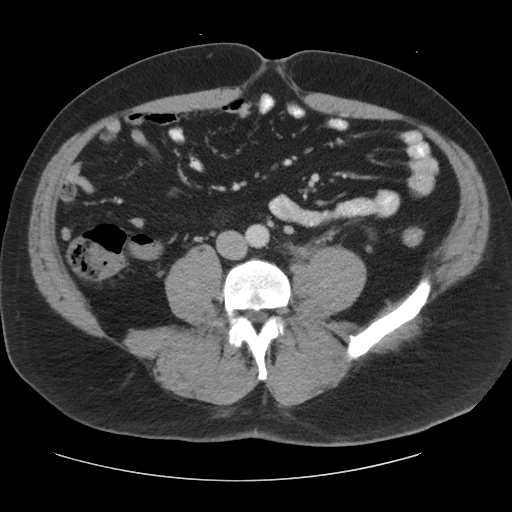
[im 51/97  soft-tissue]
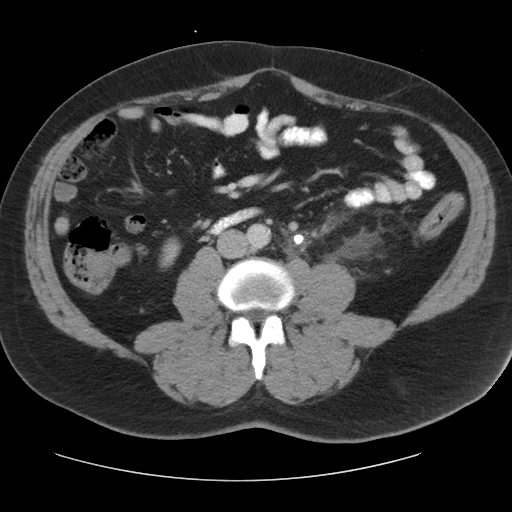
[im 57/97  soft-tissue]
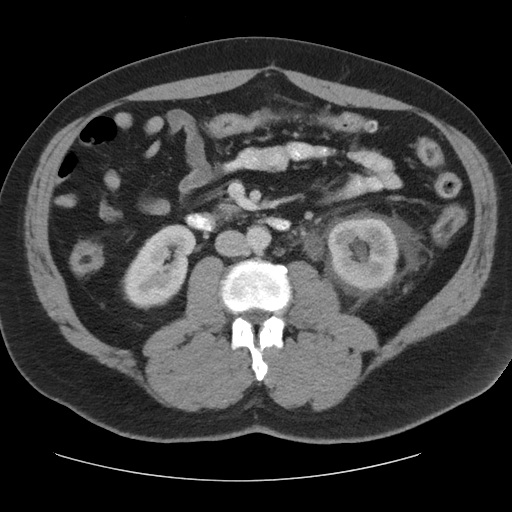
[im 57/97  bone]
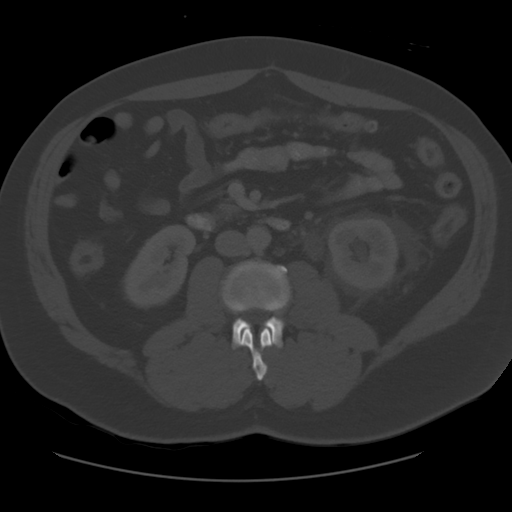
[im 63/97  soft-tissue]
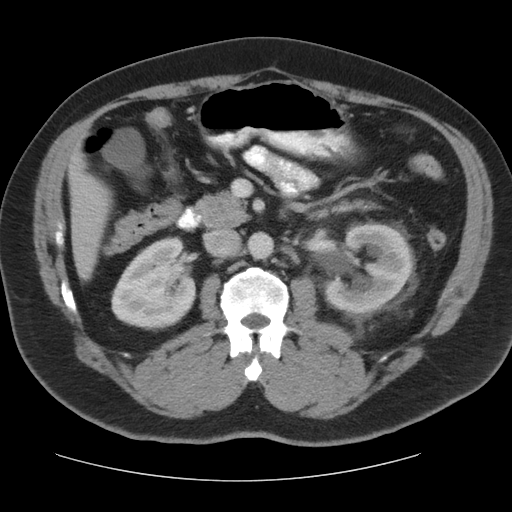
[im 74/97  soft-tissue]
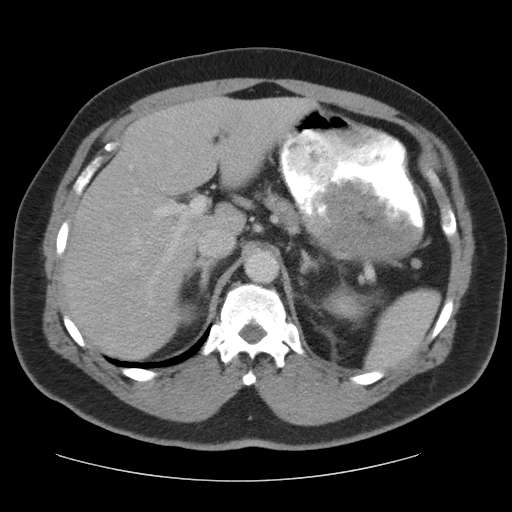
[im 80/97  soft-tissue]
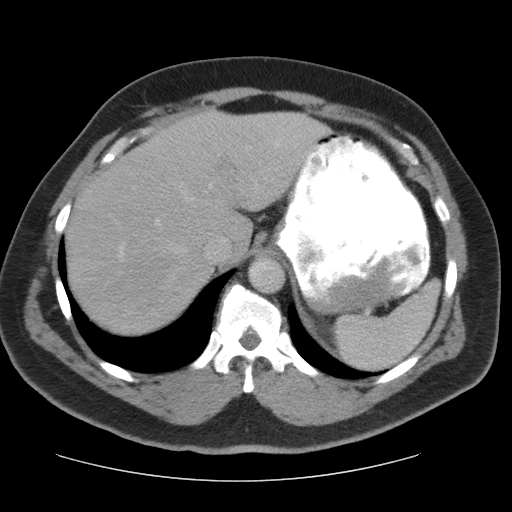
[im 85/97  soft-tissue]
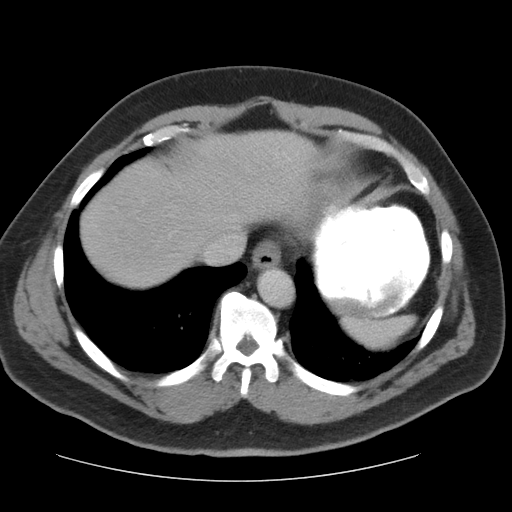
[im 91/97  soft-tissue]
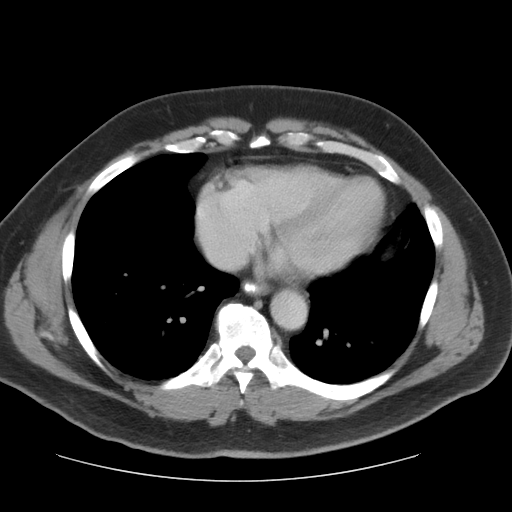

[cor · coronal · 0.94mm/px · 3 of 147 slices shown]
[im 49/147  soft-tissue]
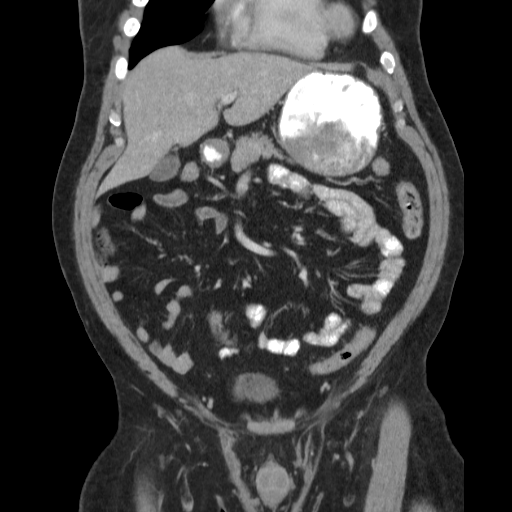
[im 65/147  soft-tissue]
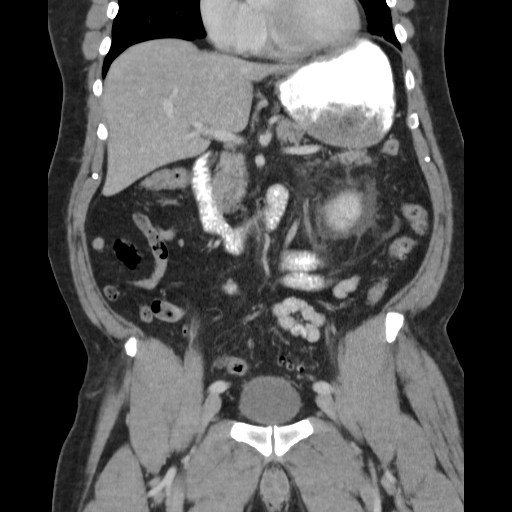
[im 82/147  soft-tissue]
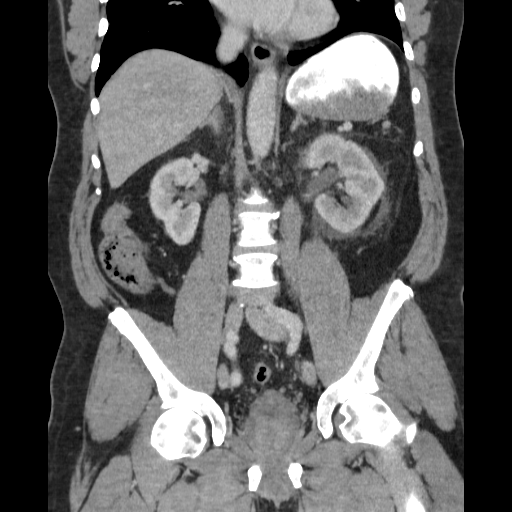

[17 of 46 positions shown; findings below may reference images not displayed]

FINDINGS: LOWER CHEST:

Mediastinum: RCA atherosclerosis.  No cardiomegaly.  Mild refluxed
or retained enteric contrast within the distal esophagus.

Lungs/pleura: No consolidation.

ABDOMEN/PELVIS:

Liver: No focal abnormality.

Biliary: No evidence of biliary obstruction or stone.

Pancreas: Unremarkable.

Spleen: Unremarkable.

Adrenals: Unremarkable.

Kidneys and ureters: 1 cm stone in the proximal left ureter, near
the junction with the mid ureter.  There is proximal hydronephrosis
with asymmetric left perinephric edema and delayed urinary
excretion.  No additional urinary calculi.

Bladder: Unremarkable.

Bowel: No obstruction. Normal appendix. Diffuse colonic
diverticulosis.

Retroperitoneum: No mass or adenopathy.

Peritoneum: No free fluid or gas.

Reproductive: Unremarkable.

Vascular: Retroaortic left renal vein.  Aortic and iliac
atherosclerosis.

OSSEOUS: No acute abnormalities. Flowing ventral vertebral body
osteophytes in the lower thoracic region.
IMPRESSION: 1. 1-cm proximal left ureteral calculus with obstructive uropathy.

2. Colonic diverticulosis.

## 2015-08-05 ENCOUNTER — Encounter: Payer: Self-pay | Admitting: Physical Therapy

## 2015-08-05 NOTE — Therapy (Signed)
[facility] [address] [place], [place], [address] Phone: [phone]   Fax:  [phone]  Patient Details  Name: Jesse Escobar MRN: [id number] Date of Birth: 1951/10/28 Referring Provider:  No ref. provider found  Encounter Date: 08/05/2015  PHYSICAL THERAPY DISCHARGE SUMMARY  Visits from Start of Care: 4  (PT Dates of Service:  02/03/15-03/03/15)  Current functional level related to goals / functional outcomes:     PT Long Term Goals - 02/04/15 1302    PT LONG TERM GOAL #1   Title Pt will be independent with HEP for improved transfers, gait and balance.   Time 4   Period Weeks   Status New   PT LONG TERM GOAL #2   Title Pt will improve Functional Gait Assessment to at least 20/30 for decreased fall risk.   Time 4   Period Weeks   Status New   PT LONG TERM GOAL #3   Title Pt will improve 5x sit<>stand to less than or equal to 15 seconds for improved transfer safety and efficiency.   Time 4   Period Weeks   Status New   PT LONG TERM GOAL #4   Title Pt will verbalize understanding of local Parkinson's resources.   Time 4   Period Weeks   Status New   PT LONG TERM GOAL #5   Title Pt will verbalize plans for continued community fitness upon D/C from PT.   Time 4   Period Weeks   Status New    LTGs were not met/not fully addressed due to patient not returning after 03/03/15 visit.        Remaining deficits: Bradykinesia, gait,b alance   Education / Equipment: HEP  Plan: Patient agrees to discharge.  Patient goals were not met. Patient is being discharged due to not returning since the last visit.  ?????     Mason Burleigh W. 08/05/2015, 9:10 AM Frazier Butt., PT [facility] [address] [place], [place], [address] Phone: [phone]   Fax:  [phone]

## 2015-09-30 ENCOUNTER — Encounter: Payer: Self-pay | Admitting: Neurology

## 2015-09-30 ENCOUNTER — Ambulatory Visit (INDEPENDENT_AMBULATORY_CARE_PROVIDER_SITE_OTHER): Payer: PPO | Admitting: Neurology

## 2015-09-30 VITALS — BP 146/88 | HR 76 | Resp 16 | Ht 68.0 in | Wt 246.0 lb

## 2015-09-30 DIAGNOSIS — G20A1 Parkinson's disease without dyskinesia, without mention of fluctuations: Secondary | ICD-10-CM

## 2015-09-30 DIAGNOSIS — R413 Other amnesia: Secondary | ICD-10-CM

## 2015-09-30 DIAGNOSIS — G20 Parkinson's disease: Secondary | ICD-10-CM | POA: Diagnosis not present

## 2015-09-30 NOTE — Patient Instructions (Addendum)
   Try to go back on the Sinemet (carbidopa) tablet twice a day, may try taking the medication 1 hour after eating. Take the selegiline in the morning and at noon.   Parkinson Disease Parkinson disease is a disorder of the brain and spinal cord (central nervous system). The person will slowly lose the ability to control his or her body movements. This happens due to:  Damaged nerve cells.  Low levels of a certain brain chemical. HOME CARE  Exercise often.  Make time to rest during the day.  Take all medicine as told by your doctor.  Replace buttons and zippers with elastic and Velcro if getting dressed is difficult.  Put grab bars or rails in your home. This helps you to not fall.  Go to speech therapy or therapy to help you with daily activities (occupational therapy). Do this as told by your doctor.  Keep all doctor visits as told. GET HELP IF:  Your medicine does not help your symptoms.  You fall.  You have trouble swallowing or choke on your food. MAKE SURE YOU:  Understand these instructions.  Will watch your condition.  Will get help right away if you are not doing well or get worse.   This information is not intended to replace advice given to you by your health care provider. Make sure you discuss any questions you have with your health care provider.   Document Released: 02/06/2012 Document Revised: 03/11/2013 Document Reviewed: 02/06/2012 Elsevier Interactive Patient Education [employer]2016 Elsevier Inc.

## 2015-09-30 NOTE — Progress Notes (Signed)
Reason for visit: Parkinson's disease  Jesse Escobar is an 64 y.o. male  History of present illness:  Jesse Escobar is a 64 year old right-handed black male with a history of Parkinson's disease. The patient has had difficulty getting on his medications. He was given a trial on Sinemet CR taking the 25/100 mg tablets twice daily. The patient indicates that he was taking the medication for about 4 weeks, but he stopped the medication because it was making him feel depressed. He has had some recent issues with kidney stones. He will take his selegiline once a day usually, occasionally twice a day as directed. The patient has not had any falls, he reports that his mild memory problems have not progressed much since last seen. He denies any issues with swallowing. He returns to the office today for an evaluation.  Past Medical History  Diagnosis Date  . Hypertension   . Parkinson's disease (HCC)   . Type 2 diabetes mellitus (HCC)   . Hyperlipidemia   . Left ureteral calculus   . History of kidney stones   . Anxiety and depression   . Obesity   . Chronic low back pain   . REM sleep behavior disorder   . Kidney stones   . Memory difficulties 07/17/2014    History reviewed. No pertinent past surgical history.  Family History  Problem Relation Age of Onset  . Stroke Father   . Diabetes    . Hypertension Sister   . Hypertension Brother   . Hypertension Sister   . Hypertension Sister   . Hypertension Brother   . Parkinson's disease Brother   . Hypertension Brother   . Hypertension Brother   . Hypertension Brother     Social history:  reports that he has never smoked. He has never used smokeless tobacco. He reports that he does not drink alcohol or use illicit drugs.   No Known Allergies  Medications:  Prior to Admission medications   Medication Sig Start Date End Date Taking? Authorizing Provider  Ascorbic Acid (VITAMIN C) 100 MG tablet Take 100 mg by mouth daily.   Yes  Historical Provider, MD  Carbidopa-Levodopa ER (SINEMET CR) 25-100 MG tablet controlled release Take 1 tablet by mouth 2 (two) times daily. 05/28/15  Yes York Spaniel, MD  losartan (COZAAR) 50 MG tablet Take 50 mg by mouth daily.   Yes Historical Provider, MD  metFORMIN (GLUCOPHAGE) 500 MG tablet Take 500 mg by mouth daily. 05/21/14  Yes Historical Provider, MD  ONE TOUCH ULTRA TEST test strip  02/17/14  Yes Historical Provider, MD  Rosuvastatin Calcium (CRESTOR PO) Take 1 tablet by mouth daily.   Yes Historical Provider, MD  selegiline (ELDEPRYL) 5 MG capsule Take 1 capsule (5 mg total) by mouth 2 (two) times daily. One in the morning and one at noon 12/31/14  Yes York Spaniel, MD  thiamine (VITAMIN B-1) 100 MG tablet Take 100 mg by mouth daily.   Yes Historical Provider, MD    ROS:  Out of a complete 14 system review of symptoms, the patient complains only of the following symptoms, and all other reviewed systems are negative.  Walking problems  Blood pressure 146/88, pulse 76, resp. rate 16, height  (1.727 m), weight 246 lb (111.585 kg).  Physical Exam  General: The patient is alert and cooperative at the time of the examination.  Skin: No significant peripheral edema is noted.   Neurologic Exam  Mental status: The patient  is alert and oriented x 3 at the time of the examination. The patient has apparent normal recent and remote memory, with an apparently normal attention span and concentration ability. Mini-Mental Status Examination done today shows a total score of 28/30.   Cranial nerves: Facial symmetry is present. Speech is normal, no aphasia or dysarthria is noted. Extraocular movements are full. Visual fields are full. Prominent masking of the face is seen.  Motor: The patient has good strength in all 4 extremities.  Sensory examination: Soft touch sensation is slightly decreased on the left face, arm, and leg.  Coordination: The patient has good  finger-nose-finger and heel-to-shin bilaterally.  Gait and station: The patient is able to arise from a seated position with arms crossed. Once up, the patient walks independently, some slowness with movement and turns. No arm swing is noted on either side. The patient does not have tremor. Tandem gait is normal. Romberg is negative. No drift is seen.  Reflexes: Deep tendon reflexes are symmetric.   Assessment/Plan:  1. Parkinson's disease  2. Mild memory disorder  3. Medical noncompliance  The patient continues to have problems with taking medications properly. He indicates he is having some side effects on the Sinemet. I have explained to him that there is no substitute for this medication, we still need to try to get him on a low dose of the drug. I have suggested Rytary, but he does not believe he will be able to afford the medication. He will retry the Sinemet CR 25/100 mg tablet twice daily. He is to go back on selegiline 5 mg in the morning, 5 mg at noon. I will follow-up in 5 months.  Marlan Palau. Keith Willis MD 09/30/2015 12:32 PM  [facility] [address] [place], [place] [address]  Phone [phone] Fax [phone]

## 2016-01-17 ENCOUNTER — Other Ambulatory Visit: Payer: Self-pay | Admitting: Neurology

## 2016-01-21 DIAGNOSIS — E1121 Type 2 diabetes mellitus with diabetic nephropathy: Secondary | ICD-10-CM | POA: Diagnosis not present

## 2016-01-21 DIAGNOSIS — G20 Parkinson's disease: Secondary | ICD-10-CM | POA: Diagnosis not present

## 2016-01-21 DIAGNOSIS — Z23 Encounter for immunization: Secondary | ICD-10-CM | POA: Diagnosis not present

## 2016-01-21 DIAGNOSIS — F39 Unspecified mood [affective] disorder: Secondary | ICD-10-CM | POA: Diagnosis not present

## 2016-01-21 DIAGNOSIS — I129 Hypertensive chronic kidney disease with stage 1 through stage 4 chronic kidney disease, or unspecified chronic kidney disease: Secondary | ICD-10-CM | POA: Diagnosis not present

## 2016-01-21 DIAGNOSIS — Z7984 Long term (current) use of oral hypoglycemic drugs: Secondary | ICD-10-CM | POA: Diagnosis not present

## 2016-01-21 DIAGNOSIS — Z0000 Encounter for general adult medical examination without abnormal findings: Secondary | ICD-10-CM | POA: Diagnosis not present

## 2016-01-21 DIAGNOSIS — N182 Chronic kidney disease, stage 2 (mild): Secondary | ICD-10-CM | POA: Diagnosis not present

## 2016-01-21 DIAGNOSIS — Z9119 Patient's noncompliance with other medical treatment and regimen: Secondary | ICD-10-CM | POA: Diagnosis not present

## 2016-01-21 DIAGNOSIS — E7800 Pure hypercholesterolemia, unspecified: Secondary | ICD-10-CM | POA: Diagnosis not present

## 2016-03-01 ENCOUNTER — Ambulatory Visit (INDEPENDENT_AMBULATORY_CARE_PROVIDER_SITE_OTHER): Payer: PPO | Admitting: Neurology

## 2016-03-01 ENCOUNTER — Encounter: Payer: Self-pay | Admitting: Neurology

## 2016-03-01 VITALS — BP 142/91 | HR 76 | Ht 68.0 in | Wt 248.0 lb

## 2016-03-01 DIAGNOSIS — R413 Other amnesia: Secondary | ICD-10-CM

## 2016-03-01 DIAGNOSIS — G4752 REM sleep behavior disorder: Secondary | ICD-10-CM | POA: Diagnosis not present

## 2016-03-01 DIAGNOSIS — R269 Unspecified abnormalities of gait and mobility: Secondary | ICD-10-CM | POA: Diagnosis not present

## 2016-03-01 DIAGNOSIS — G20 Parkinson's disease: Secondary | ICD-10-CM

## 2016-03-01 HISTORY — DX: Unspecified abnormalities of gait and mobility: R26.9

## 2016-03-01 HISTORY — DX: REM sleep behavior disorder: G47.52

## 2016-03-01 MED ORDER — CARBIDOPA-LEVODOPA ER 25-100 MG PO TBCR: 1.0000 | EXTENDED_RELEASE_TABLET | Freq: Three times a day (TID) | ORAL | Status: DC

## 2016-03-01 NOTE — Progress Notes (Addendum)
Reason for visit: Parkinson's disease  Mathews BARTT GONZAGA is an 65 y.o. male  History of present illness:  Mr. Jacob is a 65 year old right-handed black male with a history of Parkinson's disease. The patient also has a memory disorder. The patient indicates that his memory has been relatively stable, he does have some short-term memory issues which have not so far interfered with any his activities of daily living. The patient is on Sinemet taking the 25/100 mg extended-release tablets twice daily. He has had some difficulty initially tolerating the medication secondary to some nausea and some dizziness. The nausea has improved, but he still gets some slight dizziness from the tablets. 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. The patient reports that he has vivid dreams at night, sometimes he will wake up in the middle the night and see people in the house as if is in the daytime. The patient has had one fall since last seen, at times he gets over balanced forwards, sometimes backwards. The patient denies any significant issues with swallowing although occasionally he may feel something get stuck in his throat. The patient returns to the office today for an evaluation. He indicates that he is only taking 1 selegiline tablet daily.  Past Medical History  Diagnosis Date  . Hypertension   . Parkinson's disease (HCC)   . Type 2 diabetes mellitus (HCC)   . Hyperlipidemia   . Left ureteral calculus   . History of kidney stones   . Anxiety and depression   . Obesity   . Chronic low back pain   . REM sleep behavior disorder   . Kidney stones   . Memory difficulties 07/17/2014  . Sleep behavior disorder, REM 03/01/2016  . Gait disorder 03/01/2016    History reviewed. No pertinent past surgical history.  Family History  Problem Relation Age of Onset  . Stroke Father   . Diabetes    . Hypertension  Sister   . Hypertension Brother   . Hypertension Sister   . Hypertension Sister   . Hypertension Brother   . Parkinson's disease Brother   . Hypertension Brother   . Hypertension Brother   . Hypertension Brother     Social history:  reports that he has never smoked. He has never used smokeless tobacco. He reports that he does not drink alcohol or use illicit drugs.   No Known Allergies  Medications:  Prior to Admission medications   Medication Sig Start Date End Date Taking? Authorizing Provider  Ascorbic Acid (VITAMIN C) 100 MG tablet Take 100 mg by mouth daily.   Yes Historical Provider, MD  Carbidopa-Levodopa ER (SINEMET CR) 25-100 MG tablet controlled release Take 1 tablet by mouth 2 (two) times daily. 05/28/15  Yes York Spaniel, MD  losartan (COZAAR) 50 MG tablet Take 50 mg by mouth daily.   Yes Historical Provider, MD  metFORMIN (GLUCOPHAGE) 500 MG tablet Take 500 mg by mouth daily. 05/21/14  Yes Historical Provider, MD  ONE TOUCH ULTRA TEST test strip  02/17/14  Yes Historical Provider, MD  Rosuvastatin Calcium (CRESTOR PO) Take 1 tablet by mouth daily.   Yes Historical Provider, MD  selegiline (ELDEPRYL) 5 MG capsule TAKE 1 CAPSULE (5 MG TOTAL) BY MOUTH 2 (TWO) TIMES DAILY. ONE IN THE MORNING AND ONE AT NOON 01/18/16  Yes York Spaniel, MD    ROS:  Out of a complete 14 system review of symptoms, the patient complains only of the following symptoms, and all other reviewed systems are negative.  Excessive sweating Hearing loss, ringing in the ears, drooling Eye discharge, eye itching, eye redness, double vision, blurred vision Heat intolerance Constipation Insomnia, daytime sleepiness, snoring, sleep talking, acting out dreams Incontinence of the bladder Joint pain, back pain, achy muscles, walking difficulty, neck stiffness Moles Weakness, facial drooping Agitation, confusion, depression, anxiety, hallucinations, suicidal thoughts  Blood pressure 142/91, pulse 76,  height 5\' 8"  (1.727 m), weight 248 lb (112.492 kg).  Physical Exam  General: The patient is alert and cooperative at the time of the examination. The patient is markedly obese.  Skin: 1+ edema of ankles is noted bilaterally..   Neurologic Exam  Mental status: The patient is alert and oriented x 3 at the time of the examination. The Mini-Mental Status Examination done today shows a total score of 29/30. The patient is able to name 15 animals in 30 seconds..   Cranial nerves: Facial symmetry is present. Speech is normal, no aphasia or dysarthria is noted. Extraocular movements are full. Visual fields are full. Prominent masking of the face is seen.  Motor: The patient has good strength in all 4 extremities.  Sensory examination: Soft touch sensation is symmetric on the face, arms, and legs.  Coordination: The patient has good finger-nose-finger and heel-to-shin bilaterally.  Gait and station: The patient is able to arise from a seated position with arms crossed. Once up, the patient is able to ambulate independently, markedly decreased arm swing is seen bilaterally. Tandem gait is slightly unsteady. Romberg is positive, the patient leans backwards. No drift is seen.  Reflexes: Deep tendon reflexes are symmetric.   Assessment/Plan:  1. Parkinson's disease  2. Memory disorder  3. Gait disorder  4. REM sleep disorder  The patient has not been physically active. He is to try to increase his daily activity, walking regularly, and [facility]. The patient will go up on the Sinemet taking 25/100 mg extended-release tablet 3 times daily. He is not taking the selegiline twice daily, only once a day. If the sleep issues worsen, this may need to be treated. He will follow-up in 5 months, sooner if needed.  Marlan Palau. Keith Cyndie Woodbeck MD 03/01/2016 8:22 PM  [facility] [address] [place], [place] [id number]  Phone [phone] Fax  [phone]

## 2016-03-29 ENCOUNTER — Telehealth: Payer: Self-pay | Admitting: Neurology

## 2016-03-29 MED ORDER — QUETIAPINE FUMARATE 25 MG PO TABS: 25.0000 mg | ORAL_TABLET | Freq: Every day | ORAL | Status: DC

## 2016-03-29 NOTE — Telephone Encounter (Signed)
Pt's wife called sts he has been hearing and seeing things for the past 2 months progressively getting worse. He is believing that what he sees is real

## 2016-03-29 NOTE — Telephone Encounter (Signed)
Returned TC and spoke to Mrs. Raj. She says that pt has had increased hallucinations for "at least the past month." She says that he has been getting up during the night and looking out the door, thinking that things are going on around the house. He talks some about this during the day but hallucinations are occuring mostly at night. She says that he hears, sees and even smells things that aren't there. She is unsure how pt is taking his meds but believes that he's been taking Sinemet at least 2 times per day and Seligiline at least once a day. 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

## 2016-03-29 NOTE — Telephone Encounter (Signed)
I called the patient, talk with the wife. The patient is having increased hallucinations, delusional thinking and excessive thinking. The patient is not getting much sleep at night, up and down. I will call in a small prescription for Seroquel in the evening hours.

## 2016-05-02 DIAGNOSIS — N200 Calculus of kidney: Secondary | ICD-10-CM | POA: Diagnosis not present

## 2016-05-02 DIAGNOSIS — N401 Enlarged prostate with lower urinary tract symptoms: Secondary | ICD-10-CM | POA: Diagnosis not present

## 2016-05-02 DIAGNOSIS — R319 Hematuria, unspecified: Secondary | ICD-10-CM | POA: Diagnosis not present

## 2016-05-02 DIAGNOSIS — N529 Male erectile dysfunction, unspecified: Secondary | ICD-10-CM | POA: Diagnosis not present

## 2016-05-28 ENCOUNTER — Other Ambulatory Visit: Payer: Self-pay | Admitting: Neurology

## 2016-06-06 DIAGNOSIS — G20 Parkinson's disease: Secondary | ICD-10-CM | POA: Diagnosis not present

## 2016-06-06 DIAGNOSIS — H538 Other visual disturbances: Secondary | ICD-10-CM | POA: Diagnosis not present

## 2016-06-06 DIAGNOSIS — H04129 Dry eye syndrome of unspecified lacrimal gland: Secondary | ICD-10-CM | POA: Diagnosis not present

## 2016-07-20 DIAGNOSIS — E1121 Type 2 diabetes mellitus with diabetic nephropathy: Secondary | ICD-10-CM | POA: Diagnosis not present

## 2016-07-20 DIAGNOSIS — N182 Chronic kidney disease, stage 2 (mild): Secondary | ICD-10-CM | POA: Diagnosis not present

## 2016-07-20 DIAGNOSIS — I129 Hypertensive chronic kidney disease with stage 1 through stage 4 chronic kidney disease, or unspecified chronic kidney disease: Secondary | ICD-10-CM | POA: Diagnosis not present

## 2016-07-20 DIAGNOSIS — Z7984 Long term (current) use of oral hypoglycemic drugs: Secondary | ICD-10-CM | POA: Diagnosis not present

## 2016-07-20 DIAGNOSIS — B349 Viral infection, unspecified: Secondary | ICD-10-CM | POA: Diagnosis not present

## 2016-07-20 DIAGNOSIS — E7800 Pure hypercholesterolemia, unspecified: Secondary | ICD-10-CM | POA: Diagnosis not present

## 2016-07-20 DIAGNOSIS — G20 Parkinson's disease: Secondary | ICD-10-CM | POA: Diagnosis not present

## 2016-08-05 DIAGNOSIS — E1121 Type 2 diabetes mellitus with diabetic nephropathy: Secondary | ICD-10-CM | POA: Diagnosis not present

## 2016-08-05 DIAGNOSIS — N182 Chronic kidney disease, stage 2 (mild): Secondary | ICD-10-CM | POA: Diagnosis not present

## 2016-08-05 DIAGNOSIS — H9190 Unspecified hearing loss, unspecified ear: Secondary | ICD-10-CM | POA: Diagnosis not present

## 2016-08-05 DIAGNOSIS — Z23 Encounter for immunization: Secondary | ICD-10-CM | POA: Diagnosis not present

## 2016-08-08 ENCOUNTER — Other Ambulatory Visit: Payer: Self-pay

## 2016-08-08 MED ORDER — SELEGILINE HCL 5 MG PO CAPS: ORAL_CAPSULE | ORAL | 3 refills | Status: AC

## 2016-08-08 NOTE — Telephone Encounter (Signed)
90 day refills e-scribed per faxed request from pt's pharmacy.

## 2016-09-05 ENCOUNTER — Ambulatory Visit (INDEPENDENT_AMBULATORY_CARE_PROVIDER_SITE_OTHER): Payer: PPO | Admitting: Neurology

## 2016-09-05 ENCOUNTER — Encounter: Payer: Self-pay | Admitting: Neurology

## 2016-09-05 VITALS — BP 127/76 | HR 86 | Ht 68.0 in | Wt 249.0 lb

## 2016-09-05 DIAGNOSIS — G4752 REM sleep behavior disorder: Secondary | ICD-10-CM | POA: Diagnosis not present

## 2016-09-05 DIAGNOSIS — G20 Parkinson's disease: Secondary | ICD-10-CM | POA: Diagnosis not present

## 2016-09-05 DIAGNOSIS — R269 Unspecified abnormalities of gait and mobility: Secondary | ICD-10-CM | POA: Diagnosis not present

## 2016-09-05 DIAGNOSIS — R413 Other amnesia: Secondary | ICD-10-CM | POA: Diagnosis not present

## 2016-09-05 NOTE — Progress Notes (Signed)
Reason for visit:  Parkinson's disease  Jesse Escobar is an 65 y.o. male  History of present illness:   Jesse Escobar is a 65 year old right-handed black male with a history of Parkinson's disease. The patient has a mild memory disorder as well. The patient has done well with his memory, he has had no real changes since last seen. The patient had some problems with hallucinations on his medications in May 2017. He was given a prescription for Seroquel but he never took the medication. This was shortly after he increase the Sinemet CR tablets to 3 daily. The patient has been somewhat noncompliant with his medications, he only takes one instead two of the selegiline tablets daily, he now only takes 1 Sinemet daily. He does not exercise on a regular basis. He has had an occasional fall. He returns for an evaluation. He denies any problems with swallowing.   Past Medical History:  Diagnosis Date  . Anxiety and depression   . Chronic low back pain   . Gait disorder 03/01/2016  . History of kidney stones   . Hyperlipidemia   . Hypertension   . Kidney stones   . Left ureteral calculus   . Memory difficulties 07/17/2014  . Obesity   . Parkinson's disease (HCC)   . REM sleep behavior disorder   . Sleep behavior disorder, REM 03/01/2016  . Type 2 diabetes mellitus (HCC)     History reviewed. No pertinent surgical history.  Family History  Problem Relation Age of Onset  . Stroke Father   . Hypertension Sister   . Hypertension Brother   . Hypertension Sister   . Hypertension Sister   . Hypertension Brother   . Parkinson's disease Brother   . Hypertension Brother   . Hypertension Brother   . Hypertension Brother   . Diabetes      Social history:  reports that he has never smoked. He has never used smokeless tobacco. He reports that he does not drink alcohol or use drugs.   No Known Allergies  Medications:  Prior to Admission medications   Medication Sig Start Date End Date  Taking? Authorizing Provider  Ascorbic Acid (VITAMIN C) 100 MG tablet Take 100 mg by mouth daily.   Yes Historical Provider, MD  Carbidopa-Levodopa ER (SINEMET CR) 25-100 MG tablet controlled release Take 1 tablet by mouth 3 (three) times daily. 03/01/16  Yes York Spanielharles K Mykenzie Ebanks, MD  losartan (COZAAR) 50 MG tablet Take 50 mg by mouth daily.   Yes Historical Provider, MD  metFORMIN (GLUCOPHAGE) 500 MG tablet Take 500 mg by mouth daily. 05/21/14  Yes Historical Provider, MD  ONE TOUCH ULTRA TEST test strip  02/17/14  Yes Historical Provider, MD  selegiline (ELDEPRYL) 5 MG capsule TAKE 1 CAPSULE (5 MG TOTAL) BY MOUTH 2 (TWO) TIMES DAILY. ONE IN THE MORNING AND ONE AT NOON 08/08/16  Yes York Spanielharles K Deaven Barron, MD    ROS:  Out of a complete 14 system review of symptoms, the patient complains only of the following symptoms, and all other reviewed systems are negative.   Excessive sweating  Hearing loss, ringing in the ears, drooling  Eye redness, double vision, eye pain, blurred vision  Cold intolerance  Swollen abdomen, constipation  Snoring, sleep talking, sleepwalking  Incontinence of the bladder  Joint pain, joint swelling, back pain, walking difficulty  Itching  Memory loss, dizziness, numbness, speech difficulty, tremors, facial drooping  Confusion , decreased concentration, depression, anxiety, hallucinations, suicidal thoughts  Blood  pressure 127/76, pulse 86, height 5\' 8"  (1.727 m), weight 249 lb (112.9 kg).  Physical Exam  General: The patient is alert and cooperative at the time of the examination.  Skin: No significant peripheral edema is noted.   Neurologic Exam  Mental status: The patient is alert and oriented x 3 at the time of the examination. The patient has apparent normal recent and remote memory, with an apparently normal attention span and concentration ability. Mini-Mental status examination done today shows a total score of 30/30.   Cranial nerves: Facial symmetry is  present. Speech is normal, no aphasia or dysarthria is noted. Extraocular movements are full. Visual fields are full. Masking of the face is seen.  Motor: The patient has good strength in all 4 extremities.  Sensory examination: Soft touch sensation is symmetric on the face, arms, and legs.  Coordination: The patient has good finger-nose-finger and heel-to-shin bilaterally.  Gait and station: The patient is able to arise from a seated position with arms crossed. Once up, he has decreased arm swing, he will oftentimes will catch his left foot with walking, stumble. Romberg is negative.  Reflexes: Deep tendon reflexes are symmetric.   Assessment/Plan:   1. Parkinson's disease   2. Mild memory disorder   3. Gait disorder   4. Medical noncompliance   The patient has not been taking his medications as prescribed. He is to go back up on the Sinemet taking the 25/100 mg CR tablet twice daily for 4 weeks, then try to go to 1 tablet 3 times daily. If hallucinations ensue, the patient is to contact our office. He will begin the selegiline taking 1 tablet twice daily. He will enter into an exercise program, and he will follow-up in about 5 months.  Marlan Palau MD 09/05/2016 4:15 PM  [facility] [address] [place], [place] [address]  Phone [phone] Fax [phone]

## 2016-09-05 NOTE — Patient Instructions (Signed)
   Begin taking Sinemet CR 25/100 mg tablet one in the morning and one at midday, if you do well with this after 4 weeks, begin one tablet three times a day.   Begin a regular exercise program to help slow down progression of the Parkinson's disease.

## 2016-12-12 ENCOUNTER — Ambulatory Visit
Admission: RE | Admit: 2016-12-12 | Discharge: 2016-12-12 | Disposition: A | Discharge status: Home / Self Care | Payer: PPO | Source: Ambulatory Visit | Attending: Family Medicine | Admitting: Family Medicine

## 2016-12-12 ENCOUNTER — Other Ambulatory Visit: Payer: Self-pay | Admitting: Family Medicine

## 2016-12-12 DIAGNOSIS — R52 Pain, unspecified: Secondary | ICD-10-CM

## 2016-12-12 DIAGNOSIS — M25552 Pain in left hip: Secondary | ICD-10-CM | POA: Diagnosis not present

## 2016-12-12 DIAGNOSIS — M549 Dorsalgia, unspecified: Secondary | ICD-10-CM | POA: Diagnosis not present

## 2016-12-30 DIAGNOSIS — M25552 Pain in left hip: Secondary | ICD-10-CM | POA: Diagnosis not present

## 2016-12-30 DIAGNOSIS — M549 Dorsalgia, unspecified: Secondary | ICD-10-CM | POA: Diagnosis not present

## 2017-01-04 DIAGNOSIS — M1612 Unilateral primary osteoarthritis, left hip: Secondary | ICD-10-CM | POA: Diagnosis not present

## 2017-01-04 DIAGNOSIS — M5136 Other intervertebral disc degeneration, lumbar region: Secondary | ICD-10-CM | POA: Diagnosis not present

## 2017-01-04 DIAGNOSIS — M25552 Pain in left hip: Secondary | ICD-10-CM | POA: Diagnosis not present

## 2017-02-06 ENCOUNTER — Ambulatory Visit: Payer: PPO | Admitting: Neurology

## 2017-02-10 ENCOUNTER — Ambulatory Visit (INDEPENDENT_AMBULATORY_CARE_PROVIDER_SITE_OTHER): Payer: PPO | Admitting: Neurology

## 2017-02-10 ENCOUNTER — Encounter: Payer: Self-pay | Admitting: Neurology

## 2017-02-10 ENCOUNTER — Encounter (INDEPENDENT_AMBULATORY_CARE_PROVIDER_SITE_OTHER): Payer: Self-pay

## 2017-02-10 VITALS — BP 123/79 | HR 80 | Wt 243.0 lb

## 2017-02-10 DIAGNOSIS — R413 Other amnesia: Secondary | ICD-10-CM | POA: Diagnosis not present

## 2017-02-10 DIAGNOSIS — G4752 REM sleep behavior disorder: Secondary | ICD-10-CM | POA: Diagnosis not present

## 2017-02-10 DIAGNOSIS — G20 Parkinson's disease: Secondary | ICD-10-CM

## 2017-02-10 DIAGNOSIS — R269 Unspecified abnormalities of gait and mobility: Secondary | ICD-10-CM | POA: Diagnosis not present

## 2017-02-10 MED ORDER — CARBIDOPA-LEVODOPA ER 25-100 MG PO TBCR: 1.0000 | EXTENDED_RELEASE_TABLET | Freq: Three times a day (TID) | ORAL | 2 refills | Status: DC

## 2017-02-10 NOTE — Progress Notes (Signed)
Reason for visit: Parkinson's disease  Jesse Escobar is an 66 y.o. male  History of present illness:  Jesse Escobar is a 66 year old right-handed black male with a history of Parkinson's disease. The patient has been somewhat noncompliant with his medications, he does not always take his medications as prescribed. He is taking the medication with food. He will have occasional hallucinations, nothing that is threatening to him. He has a gait disorder, he is using a walker for ambulation. He fell in December 2017, he has hurt his left hip. He is followed through orthopedic surgery and he has gotten injections in the hip but he does have discomfort with weightbearing. The patient does not exercise regularly. He denies problems with swallowing. He believes that the memory issue has been stable over time. He returns to this office for an evaluation.  Past Medical History:  Diagnosis Date  . Anxiety and depression   . Chronic low back pain   . Gait disorder 03/01/2016  . History of kidney stones   . Hyperlipidemia   . Hypertension   . Kidney stones   . Left ureteral calculus   . Memory difficulties 07/17/2014  . Obesity   . Parkinson's disease (HCC)   . REM sleep behavior disorder   . Sleep behavior disorder, REM 03/01/2016  . Type 2 diabetes mellitus (HCC)     History reviewed. No pertinent surgical history.  Family History  Problem Relation Age of Onset  . Stroke Father   . Hypertension Sister   . Hypertension Brother   . Hypertension Sister   . Hypertension Sister   . Hypertension Brother   . Parkinson's disease Brother   . Hypertension Brother   . Hypertension Brother   . Hypertension Brother   . Diabetes      Social history:  reports that he has never smoked. He has never used smokeless tobacco. He reports that he does not drink alcohol or use drugs.   No Known Allergies  Medications:  Prior to Admission medications   Medication Sig Start Date End Date Taking?  Authorizing Provider  Ascorbic Acid (VITAMIN C) 100 MG tablet Take 100 mg by mouth daily.   Yes Historical Provider, MD  Carbidopa-Levodopa ER (SINEMET CR) 25-100 MG tablet controlled release Take 1 tablet by mouth 3 (three) times daily. 03/01/16  Yes York Spanielharles K Willis, MD  losartan (COZAAR) 50 MG tablet Take 50 mg by mouth daily.   Yes Historical Provider, MD  metFORMIN (GLUCOPHAGE) 500 MG tablet Take 500 mg by mouth daily. 05/21/14  Yes Historical Provider, MD  ONE TOUCH ULTRA TEST test strip  02/17/14  Yes Historical Provider, MD  selegiline (ELDEPRYL) 5 MG capsule TAKE 1 CAPSULE (5 MG TOTAL) BY MOUTH 2 (TWO) TIMES DAILY. ONE IN THE MORNING AND ONE AT NOON 08/08/16  Yes York Spanielharles K Willis, MD    ROS:  Out of a complete 14 system review of symptoms, the patient complains only of the following symptoms, and all other reviewed systems are negative.  Hearing loss, ringing in the ears Heat intolerance Insomnia, snoring, sleep talking Incontinence of the bladder Joint pain, joint swelling, back pain, aching muscles, muscle cramps, walking difficulty Moles Memory loss, numbness, weakness, tremors Confusion, decreased concentration, depression, anxiety, hallucinations  Blood pressure 123/79, pulse 80, weight 243 lb (110.2 kg).  Physical Exam  General: The patient is alert and cooperative at the time of the examination.  Skin: No significant peripheral edema is noted.   Neurologic Exam  Mental status: The patient is alert and oriented x 3 at the time of the examination. The Mini-Mental Status Examination done today shows a total score 28/30. The patient is able to name 14 four legged animals in 1 minute.   Cranial nerves: Facial symmetry is present. Speech is normal, no aphasia or dysarthria is noted. Extraocular movements are full. Visual fields are full. Masking of the face is seen.  Motor: The patient has good strength in all 4 extremities.  Sensory examination: Soft touch sensation is  symmetric on the face, arms, and legs.  Coordination: The patient has good finger-nose-finger and heel-to-shin bilaterally.  Gait and station: The patient will walk with a walker, he has good stride, good turns with a walker. Tandem gait was not attempted. Romberg is negative. No drift is seen.  Reflexes: Deep tendon reflexes are symmetric.   Assessment/Plan:  1. Parkinson's disease  2. Gait disorder  3. Mild memory disorder  The patient will try taking the Sinemet regularly 1 tablet 3 times a day prior to eating. If he is not better in 3 or 4 weeks, he will contact our office and I will go up on the Sinemet dose. He will follow-up in about 5 months. I have encouraged him to exercise on a regular basis.  Marlan Palau MD 02/10/2017 1:00 PM  [facility] [address] [place], [place] [address]  Phone [phone] Fax [phone]

## 2017-03-13 DIAGNOSIS — I129 Hypertensive chronic kidney disease with stage 1 through stage 4 chronic kidney disease, or unspecified chronic kidney disease: Secondary | ICD-10-CM | POA: Diagnosis not present

## 2017-03-13 DIAGNOSIS — Z0000 Encounter for general adult medical examination without abnormal findings: Secondary | ICD-10-CM | POA: Diagnosis not present

## 2017-03-13 DIAGNOSIS — Z125 Encounter for screening for malignant neoplasm of prostate: Secondary | ICD-10-CM | POA: Diagnosis not present

## 2017-03-13 DIAGNOSIS — N182 Chronic kidney disease, stage 2 (mild): Secondary | ICD-10-CM | POA: Diagnosis not present

## 2017-03-13 DIAGNOSIS — E1121 Type 2 diabetes mellitus with diabetic nephropathy: Secondary | ICD-10-CM | POA: Diagnosis not present

## 2017-03-13 DIAGNOSIS — Z7984 Long term (current) use of oral hypoglycemic drugs: Secondary | ICD-10-CM | POA: Diagnosis not present

## 2017-03-13 DIAGNOSIS — G20 Parkinson's disease: Secondary | ICD-10-CM | POA: Diagnosis not present

## 2017-03-13 DIAGNOSIS — E7800 Pure hypercholesterolemia, unspecified: Secondary | ICD-10-CM | POA: Diagnosis not present

## 2017-03-13 DIAGNOSIS — F39 Unspecified mood [affective] disorder: Secondary | ICD-10-CM | POA: Diagnosis not present

## 2017-03-13 DIAGNOSIS — Z1389 Encounter for screening for other disorder: Secondary | ICD-10-CM | POA: Diagnosis not present

## 2017-04-03 ENCOUNTER — Telehealth: Payer: Self-pay | Admitting: Neurology

## 2017-04-03 NOTE — Telephone Encounter (Signed)
Pt's wife called to advise he has been taking Carbidopa-Levodopa ER (SINEMET CR) 25-100 MG tablet controlled release as directed but they have not noticed a difference. She said it was discussed at OV to call, medication may need to be increased. She said he has a lot of 25-100mg . Please call to advise

## 2017-04-03 NOTE — Telephone Encounter (Signed)
I called the patient, talk with the wife. The patient has not gained much benefit with the Sinemet, we will go to 1.5 tablets 3 times daily. He seems to be tolerating the medication otherwise fairly well.

## 2017-05-01 DIAGNOSIS — N400 Enlarged prostate without lower urinary tract symptoms: Secondary | ICD-10-CM | POA: Diagnosis not present

## 2017-05-01 DIAGNOSIS — N528 Other male erectile dysfunction: Secondary | ICD-10-CM | POA: Diagnosis not present

## 2017-05-01 DIAGNOSIS — N200 Calculus of kidney: Secondary | ICD-10-CM | POA: Diagnosis not present

## 2017-05-02 DIAGNOSIS — M1612 Unilateral primary osteoarthritis, left hip: Secondary | ICD-10-CM | POA: Diagnosis not present

## 2017-05-08 DIAGNOSIS — M1612 Unilateral primary osteoarthritis, left hip: Secondary | ICD-10-CM | POA: Diagnosis not present

## 2017-05-16 ENCOUNTER — Telehealth: Payer: Self-pay | Admitting: Neurology

## 2017-05-16 NOTE — Telephone Encounter (Signed)
I called the wife. The patient is requiring increased supervision. The wife works at [employer], she want to take off 12 weeks starting in August. She will bring by the form so that we can fill this out.

## 2017-05-16 NOTE — Telephone Encounter (Signed)
The patients wife called and wanted to know if Dr. Anne HahnWillis would discuss with her the option of having FMLA for her husbands care. She would like to discuss this with him or the nurse before she brings the forms in to our office. Please call and advise at [phone].

## 2017-05-22 DIAGNOSIS — G20 Parkinson's disease: Secondary | ICD-10-CM | POA: Diagnosis present

## 2017-05-22 DIAGNOSIS — E119 Type 2 diabetes mellitus without complications: Secondary | ICD-10-CM

## 2017-05-22 DIAGNOSIS — M1612 Unilateral primary osteoarthritis, left hip: Secondary | ICD-10-CM | POA: Diagnosis not present

## 2017-05-22 DIAGNOSIS — N182 Chronic kidney disease, stage 2 (mild): Secondary | ICD-10-CM | POA: Diagnosis present

## 2017-05-22 DIAGNOSIS — E785 Hyperlipidemia, unspecified: Secondary | ICD-10-CM | POA: Diagnosis present

## 2017-05-22 NOTE — H&P (Signed)
PREOPERATIVE H&P Patient ID: Jesse Escobar MRN: [id number] DOB/AGE: 1950/12/26 66 y.o.  Chief Complaint: OA LEFT HIP  Planned Procedure Date: 06/13/17 Medical Clearance by Dr. Valentina LucksGriffin  Additional clearance by Neurology: Dr. Anne HahnWillis  HPI: Jesse Escobar is a 66 y.o. male with a history of Parkinsons, DM, HLD, and mild CKD who presents for evaluation of OA LEFT HIP. The patient has a history of pain and functional disability in the left hip due to arthritis and has failed non-surgical conservative treatments for greater than 12 weeks to include NSAID's and/or analgesics, corticosteriod injections and activity modification.  Onset of symptoms was abrupt, starting 1 years ago after a fall with gradually worsening course since that time.  Patient currently rates pain at 9 out of 10 with activity. Patient has night pain, worsening of pain with activity and weight bearing, pain that interferes with activities of daily living and pain with passive range of motion.  Patient has evidence of subchondral cysts, subchondral sclerosis, periarticular osteophytes and joint space narrowing by imaging studies.  There is no active infection.  Past Medical History:  Diagnosis Date  . Anxiety and depression   . Chronic low back pain   . Gait disorder 03/01/2016  . History of kidney stones   . Hyperlipidemia   . Hypertension   . Kidney stones   . Left ureteral calculus   . Memory difficulties 07/17/2014  . Obesity   . Parkinson's disease (HCC)   . REM sleep behavior disorder   . Sleep behavior disorder, REM 03/01/2016  . Type 2 diabetes mellitus (HCC)    Past Surgical History: Kidney Stone removal 2014  No Known Allergies   Prior to Admission medications   Medication Sig Start Date End Date Taking? Authorizing Provider  Ascorbic Acid (VITAMIN C) 100 MG tablet Take 100 mg by mouth daily.    [provider]  Carbidopa-Levodopa ER (SINEMET CR) 25-100 MG tablet controlled release Take 1 tablet by  mouth 3 (three) times daily. 02/10/17   York SpanielWillis, Charles K, MD  losartan (COZAAR) 50 MG tablet Take 50 mg by mouth daily.    [provider]  metFORMIN (GLUCOPHAGE) 500 MG tablet Take 500 mg by mouth daily. 05/21/14   [provider]  ONE TOUCH ULTRA TEST test strip  02/17/14   [provider]  selegiline (ELDEPRYL) 5 MG capsule TAKE 1 CAPSULE (5 MG TOTAL) BY MOUTH 2 (TWO) TIMES DAILY. ONE IN THE MORNING AND ONE AT NOON 08/08/16   York SpanielWillis, Charles K, MD   Social History   Social History  . Marital status: Married    Spouse name: Venita SheffieldGladys  . Number of children: 3  . Years of education: 6316   Occupational History  . Retired [employer]   Social History Main Topics  . Smoking status: Never Smoker  . Smokeless tobacco: Never Used  . Alcohol use No  . Drug use: No  . Sexual activity: Not on file     Comment: Married   Other Topics Concern  . Not on file   Social History Narrative   Patient is married Venita Sheffield(Gladys) and lives at home with his wife and children.   Patient has a college education.   Patient is right-handed.   Patient drinks very little caffeine.            Family History  Problem Relation Age of Onset  . Stroke Father   . Hypertension Sister   . Hypertension Brother   .  Hypertension Sister   . Hypertension Sister   . Hypertension Brother   . Parkinson's disease Brother   . Hypertension Brother   . Hypertension Brother   . Hypertension Brother   . Diabetes Unknown     ROS: Currently denies lightheadedness, dizziness, Fever, chills, CP, SOB.   No personal history of DVT, PE, MI, or CVA. No loose teeth or dentures.  Bridge in front. All other systems have been reviewed and were otherwise currently negative with the exception of those mentioned in the HPI and as above.  Objective: Vitals: Ht: 5'9 Wt: 247 Temp: 98.2 BP: 132/86 Pulse: 98 O2 99% on room air. Physical Exam: General: Alert, NAD.  Trendelenberg Gait.  Utilizes  rolling walker. HEENT: EOMI, Good Neck Extension  Pulm: No increased work of breathing.  Clear B/L A/P w/o crackle or wheeze.  CV: RRR, No m/g/r appreciated  GI: soft, NT, ND Neuro: Flat affect.  Sensation intact distally Skin: No lesions in the area of chief complaint MSK/Surgical Site: Left Hip Non tender over greater trochanter.  Pain with passive ROM.  Positive Stinchfield.  4/5 strength.  NVI.  Sensation intact distally.   Imaging Review Plain radiographs demonstrate severe degenerative joint disease of the left hip.   Assessment: OA LEFT HIP Active Problems:   Parkinson's disease (HCC)   Hyperlipidemia   CKD (chronic kidney disease) stage 2, GFR 60-89 ml/min   Type 2 diabetes mellitus (HCC)   Plan: Plan for Procedure(s): TOTAL HIP ARTHROPLASTY ANTERIOR APPROACH  The patient history, physical exam, clinical judgement of the provider and imaging are consistent with end stage degenerative joint disease and total joint arthroplasty is deemed medically necessary. The treatment options including medical management, injection therapy, and arthroplasty were discussed at length. The risks and benefits of Procedure(s): TOTAL HIP ARTHROPLASTY ANTERIOR APPROACH were presented and reviewed.  The risks of nonoperative treatment, versus surgical intervention including but not limited to continued pain, aseptic loosening, stiffness, dislocation/subluxation, infection, bleeding, nerve injury, blood clots, cardiopulmonary complications, morbidity, mortality, among others were discussed. The patient verbalizes understanding and wishes to proceed with the plan.  Patient is being admitted for inpatient treatment for surgery, pain control, PT, OT, prophylactic antibiotics, VTE prophylaxis, progressive ambulation, ADL's and discharge planning.   Dental prophylaxis discussed and recommended for 2 years postoperatively.   The patient does meet the criteria for TXA which will be used perioperatively  via IV.    ASA 325 mg  will be used postoperatively for DVT prophylaxis in addition to SCDs, and early ambulation.  The patient is planning to be discharged home with home health services ([facility]) in care of Berwyn Bigley.  He does have 15 stairs in home, so hospital bed may be arranged and he may stay an additional day to work on stairs.  [facility], SNF is second option to HHPT.  Albina Billet III, PA-C 05/22/2017 3:13 PM

## 2017-05-22 NOTE — Telephone Encounter (Signed)
Gave completed/signed FMLA paperwork to medical records to process for patient.

## 2017-05-23 ENCOUNTER — Telehealth: Payer: Self-pay | Admitting: *Deleted

## 2017-05-23 NOTE — Telephone Encounter (Signed)
Called pt Jesse Escobar pt form is ready for pick up.

## 2017-05-25 DIAGNOSIS — Z0289 Encounter for other administrative examinations: Secondary | ICD-10-CM

## 2017-06-01 ENCOUNTER — Encounter (HOSPITAL_COMMUNITY): Payer: Self-pay

## 2017-06-01 NOTE — Pre-Procedure Instructions (Signed)
[place] [facility]  06/01/2017      [id number] [place] [facility], [place] - [address] [place] [id number] Phone: [phone] Fax: [phone]    Your procedure is scheduled on Tues. July 17  [facility] at 5:30 A.M.  Call this number if you have problems the morning of surgery:  [phone]   Remember:  Do not eat food or drink liquids after midnight on Mon. July 16   Take these medicines the morning of surgery with A SIP OF WATER : carbidopa-levodopa ER ( sinemet), selegiline (eldepryl)              1 week prior to surgery stop: advil, motrin,aleve, naproxen sodium, ibuprofen, BC Powders, vitamins and herbal medicines     How to Manage Your Diabetes Before and After Surgery  Why is it important to control my blood sugar before and after surgery? . Improving blood sugar levels before and after surgery helps healing and can limit problems. . A way of improving blood sugar control is eating a healthy diet by: o  Eating less sugar and carbohydrates o  Increasing activity/exercise o  Talking with your doctor about reaching your blood sugar goals . High blood sugars (greater than 180 mg/dL) can raise your risk of infections and slow your recovery, so you will need to focus on controlling your diabetes during the weeks before surgery. . Make sure that the doctor who takes care of your diabetes knows about your planned surgery including the date and location.  How do I manage my blood sugar before surgery? . Check your blood sugar at least 4 times a day, starting 2 days before surgery, to make sure that the level is not too high or low. o Check your blood sugar the morning of your surgery when you wake up and every 2 hours until you get to the Short Stay unit. . If your blood sugar is less than 70 mg/dL, you will need to treat for low blood sugar: o Do not take insulin. o Treat a low blood sugar (less than 70  mg/dL) with  cup of clear juice (cranberry or apple), 4 glucose tablets, OR glucose gel. o Recheck blood sugar in 15 minutes after treatment (to make sure it is greater than 70 mg/dL). If your blood sugar is not greater than 70 mg/dL on recheck, call [phone] for further instructions. . Report your blood sugar to the short stay nurse when you get to Short Stay.  . If you are admitted to the hospital after surgery: o Your blood sugar will be checked by the staff and you will probably be given insulin after surgery (instead of oral diabetes medicines) to make sure you have good blood sugar levels. o The goal for blood sugar control after surgery is 80-180 mg/dL.      WHAT DO I DO ABOUT MY DIABETES MEDICATION?   Marland Kitchen. Do not take oral diabetes medicines (pills) the morning of surgery.        Do not wear jewelry.  Do not wear lotions, powders, or perfumes, or deoderant.  Do not shave 48 hours prior to surgery.  Men may shave face and neck.  Do not bring valuables to the hospital.  [facility] is not responsible for any belongings or valuables.  Contacts, dentures or bridgework may not be worn into surgery.  Leave your suitcase in the car.  After surgery it may be brought  to your room.  For patients admitted to the hospital, discharge time will be determined by your treatment team.  Patients discharged the day of surgery will not be allowed to drive home.    Special instructions:  [facility]- Preparing For Surgery  Before surgery, you can play an important role. Because skin is not sterile, your skin needs to be as free of germs as possible. You can reduce the number of germs on your skin by washing with CHG (chlorahexidine gluconate) Soap before surgery.  CHG is an antiseptic cleaner which kills germs and bonds with the skin to continue killing germs even after washing.  Please do not use if you have an allergy to CHG or antibacterial soaps. If your skin becomes reddened/irritated  stop using the CHG.  Do not shave (including legs and underarms) for at least 48 hours prior to first CHG shower. It is OK to shave your face.  Please follow these instructions carefully.   1. Shower the NIGHT BEFORE SURGERY and the MORNING OF SURGERY with CHG.   2. If you chose to wash your hair, wash your hair first as usual with your normal shampoo.  3. After you shampoo, rinse your hair and body thoroughly to remove the shampoo.  4. Use CHG as you would any other liquid soap. You can apply CHG directly to the skin and wash gently with a scrungie or a clean washcloth.   5. Apply the CHG Soap to your body ONLY FROM THE NECK DOWN.  Do not use on open wounds or open sores. Avoid contact with your eyes, ears, mouth and genitals (private parts). Wash genitals (private parts) with your normal soap.  6. Wash thoroughly, paying special attention to the area where your surgery will be performed.  7. Thoroughly rinse your body with warm water from the neck down.  8. DO NOT shower/wash with your normal soap after using and rinsing off the CHG Soap.  9. Pat yourself dry with a CLEAN TOWEL.   10. Wear CLEAN PAJAMAS   11. Place CLEAN SHEETS on your bed the night of your first shower and DO NOT SLEEP WITH PETS.    Day of Surgery: Do not apply any deodorants/lotions. Please wear clean clothes to the hospital/surgery center.      Please read over the following fact sheets that you were given. Coughing and Deep Breathing, MRSA Information and Surgical Site Infection Prevention

## 2017-06-02 ENCOUNTER — Encounter (HOSPITAL_COMMUNITY): Payer: Self-pay

## 2017-06-02 ENCOUNTER — Encounter (HOSPITAL_COMMUNITY)
Admission: RE | Admit: 2017-06-02 | Discharge: 2017-06-02 | Disposition: A | Discharge status: Home / Self Care | Payer: PPO | Source: Ambulatory Visit | Attending: Orthopedic Surgery | Admitting: Orthopedic Surgery

## 2017-06-02 DIAGNOSIS — Z01818 Encounter for other preprocedural examination: Secondary | ICD-10-CM | POA: Diagnosis not present

## 2017-06-02 DIAGNOSIS — M1712 Unilateral primary osteoarthritis, left knee: Secondary | ICD-10-CM | POA: Diagnosis not present

## 2017-06-02 HISTORY — DX: Unspecified osteoarthritis, unspecified site: M19.90

## 2017-06-02 HISTORY — DX: Mild cognitive impairment of uncertain or unknown etiology: G31.84

## 2017-06-02 LAB — CBC
HEMATOCRIT: 45.1 % (ref 39.0–52.0)
Hemoglobin: 14.9 g/dL (ref 13.0–17.0)
MCH: 29.3 pg (ref 26.0–34.0)
MCHC: 33 g/dL (ref 30.0–36.0)
MCV: 88.8 fL (ref 78.0–100.0)
Platelets: 209 10*3/uL (ref 150–400)
RBC: 5.08 MIL/uL (ref 4.22–5.81)
RDW: 13.6 % (ref 11.5–15.5)
WBC: 5.4 10*3/uL (ref 4.0–10.5)

## 2017-06-02 LAB — BASIC METABOLIC PANEL
Anion gap: 10 (ref 5–15)
BUN: 13 mg/dL (ref 6–20)
CHLORIDE: 104 mmol/L (ref 101–111)
CO2: 24 mmol/L (ref 22–32)
CREATININE: 1.18 mg/dL (ref 0.61–1.24)
Calcium: 9.7 mg/dL (ref 8.9–10.3)
GFR calc Af Amer: >60 mL/min (ref >60)
GFR calc non Af Amer: >60 mL/min (ref >60)
GLUCOSE: 100 mg/dL — AB (ref 65–99)
POTASSIUM: 4.2 mmol/L (ref 3.5–5.1)
Sodium: 138 mmol/L (ref 135–145)

## 2017-06-02 LAB — SURGICAL PCR SCREEN
MRSA, PCR: NEGATIVE
STAPHYLOCOCCUS AUREUS: NEGATIVE

## 2017-06-02 LAB — GLUCOSE, CAPILLARY: GLUCOSE-CAPILLARY: 110 mg/dL — AB (ref 65–99)

## 2017-06-02 NOTE — Progress Notes (Signed)
   06/02/17 1326  OBSTRUCTIVE SLEEP APNEA  Have you ever been diagnosed with sleep apnea through a sleep study? No  Do you snore loudly (loud enough to be heard through closed doors)?  1  Do you often feel tired, fatigued, or sleepy during the daytime (such as falling asleep during driving or talking to someone)? 0  Has anyone observed you stop breathing during your sleep? 0  Do you have, or are you being treated for high blood pressure? 1  BMI more than 35 kg/m2? 1  Age > 50 (1-yes) 1  Neck circumference greater than:Male 16 inches or larger, Male 17inches or larger? 1  Male Gender (Yes=1) 1  Obstructive Sleep Apnea Score 6  Score 5 or greater  Results sent to PCP

## 2017-06-02 NOTE — Progress Notes (Signed)
PCP: Dr. Consuella LoseElaine griffin--request notes/ekg  Neurologist: Dr. Lesia SagoKeith Willis  Fasting sugars 140's

## 2017-06-03 LAB — HEMOGLOBIN A1C
HEMOGLOBIN A1C: 6.9 % — AB (ref 4.8–5.6)
MEAN PLASMA GLUCOSE: 151 mg/dL

## 2017-06-05 NOTE — Progress Notes (Signed)
Anesthesia Chart Review:  Pt is a 66 year old male scheduled for L total hip arthroplasty anterior approach on 06/13/2017 with Margarita Ranaimothy Murphy, M.D.  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 Never smoker. BMI 36.5.  Medications include: Carbidopa-levodopa, Zetia, losartan, metformin, selegiline (taking for parkinson's).   Preoperative labs reviewed. HbA1c 6.9, glucose 100  EKG will be obtained DOS.   If EKG acceptable, I anticipate patient can proceed as scheduled  Rica Mastngela Damya Comley, FNP-BC [facility]Anesthesiology Phone: [phone] 06/05/2017 2:08 PM

## 2017-06-12 MED ORDER — ACETAMINOPHEN 500 MG PO TABS
1000.0000 mg | ORAL_TABLET | Freq: Once | ORAL | Status: AC
  Administered 2017-06-13: 1000 mg via ORAL
  Filled 2017-06-12: qty 2

## 2017-06-12 MED ORDER — CEFAZOLIN SODIUM-DEXTROSE 2-4 GM/100ML-% IV SOLN
2.0000 g | INTRAVENOUS | Status: AC
  Administered 2017-06-13: 2 g via INTRAVENOUS
  Filled 2017-06-12: qty 100

## 2017-06-12 MED ORDER — TRANEXAMIC ACID 1000 MG/10ML IV SOLN
2000.0000 mg | Freq: Once | INTRAVENOUS | Status: AC
  Administered 2017-06-13: 2000 mg via TOPICAL
  Filled 2017-06-12: qty 20

## 2017-06-12 MED ORDER — LACTATED RINGERS IV SOLN
INTRAVENOUS | Status: DC
  Administered 2017-06-13 (×2): via INTRAVENOUS

## 2017-06-12 MED ORDER — TRANEXAMIC ACID 1000 MG/10ML IV SOLN
1000.0000 mg | INTRAVENOUS | Status: AC
  Administered 2017-06-13: 1000 mg via INTRAVENOUS
  Filled 2017-06-12: qty 1100

## 2017-06-13 ENCOUNTER — Encounter (HOSPITAL_COMMUNITY)
Admission: RE | Disposition: A | Discharge status: Home / Self Care | Payer: Self-pay | Source: Ambulatory Visit | Attending: Orthopedic Surgery

## 2017-06-13 ENCOUNTER — Encounter (HOSPITAL_COMMUNITY): Payer: Self-pay

## 2017-06-13 ENCOUNTER — Inpatient Hospital Stay (HOSPITAL_COMMUNITY): Payer: PPO

## 2017-06-13 ENCOUNTER — Inpatient Hospital Stay (HOSPITAL_COMMUNITY): Payer: PPO | Admitting: Anesthesiology

## 2017-06-13 ENCOUNTER — Inpatient Hospital Stay (HOSPITAL_COMMUNITY)
Admission: RE | Admit: 2017-06-13 | Discharge: 2017-06-15 | DRG: 470 | Disposition: A | Discharge status: Home / Self Care | Payer: PPO | Source: Ambulatory Visit | Attending: Orthopedic Surgery | Admitting: Orthopedic Surgery

## 2017-06-13 ENCOUNTER — Inpatient Hospital Stay (HOSPITAL_COMMUNITY): Payer: PPO | Admitting: Emergency Medicine

## 2017-06-13 DIAGNOSIS — Z471 Aftercare following joint replacement surgery: Secondary | ICD-10-CM | POA: Diagnosis not present

## 2017-06-13 DIAGNOSIS — Z7984 Long term (current) use of oral hypoglycemic drugs: Secondary | ICD-10-CM

## 2017-06-13 DIAGNOSIS — M1990 Unspecified osteoarthritis, unspecified site: Secondary | ICD-10-CM | POA: Diagnosis not present

## 2017-06-13 DIAGNOSIS — G4752 REM sleep behavior disorder: Secondary | ICD-10-CM | POA: Diagnosis present

## 2017-06-13 DIAGNOSIS — E785 Hyperlipidemia, unspecified: Secondary | ICD-10-CM | POA: Diagnosis present

## 2017-06-13 DIAGNOSIS — R269 Unspecified abnormalities of gait and mobility: Secondary | ICD-10-CM | POA: Diagnosis present

## 2017-06-13 DIAGNOSIS — G20 Parkinson's disease: Secondary | ICD-10-CM | POA: Diagnosis not present

## 2017-06-13 DIAGNOSIS — E669 Obesity, unspecified: Secondary | ICD-10-CM | POA: Diagnosis present

## 2017-06-13 DIAGNOSIS — E1122 Type 2 diabetes mellitus with diabetic chronic kidney disease: Secondary | ICD-10-CM | POA: Diagnosis present

## 2017-06-13 DIAGNOSIS — I129 Hypertensive chronic kidney disease with stage 1 through stage 4 chronic kidney disease, or unspecified chronic kidney disease: Secondary | ICD-10-CM | POA: Diagnosis not present

## 2017-06-13 DIAGNOSIS — Z79899 Other long term (current) drug therapy: Secondary | ICD-10-CM | POA: Diagnosis not present

## 2017-06-13 DIAGNOSIS — Z6836 Body mass index (BMI) 36.0-36.9, adult: Secondary | ICD-10-CM

## 2017-06-13 DIAGNOSIS — G8929 Other chronic pain: Secondary | ICD-10-CM | POA: Diagnosis present

## 2017-06-13 DIAGNOSIS — M545 Low back pain: Secondary | ICD-10-CM | POA: Diagnosis not present

## 2017-06-13 DIAGNOSIS — F068 Other specified mental disorders due to known physiological condition: Secondary | ICD-10-CM | POA: Diagnosis present

## 2017-06-13 DIAGNOSIS — Z96652 Presence of left artificial knee joint: Secondary | ICD-10-CM | POA: Diagnosis not present

## 2017-06-13 DIAGNOSIS — M1612 Unilateral primary osteoarthritis, left hip: Secondary | ICD-10-CM | POA: Diagnosis not present

## 2017-06-13 DIAGNOSIS — Z9181 History of falling: Secondary | ICD-10-CM | POA: Diagnosis present

## 2017-06-13 DIAGNOSIS — E119 Type 2 diabetes mellitus without complications: Secondary | ICD-10-CM

## 2017-06-13 DIAGNOSIS — N182 Chronic kidney disease, stage 2 (mild): Secondary | ICD-10-CM | POA: Diagnosis present

## 2017-06-13 HISTORY — PX: TOTAL HIP ARTHROPLASTY: SHX124

## 2017-06-13 LAB — GLUCOSE, CAPILLARY
GLUCOSE-CAPILLARY: 130 mg/dL — AB (ref 65–99)
GLUCOSE-CAPILLARY: 152 mg/dL — AB (ref 65–99)
GLUCOSE-CAPILLARY: 117 mg/dL — AB (ref 65–99)
Glucose-Capillary: 93 mg/dL (ref 65–99)
Glucose-Capillary: 77 mg/dL (ref 65–99)

## 2017-06-13 SURGERY — ARTHROPLASTY, HIP, TOTAL, ANTERIOR APPROACH
Anesthesia: Spinal | Site: Hip | Laterality: Left

## 2017-06-13 MED ORDER — DOCUSATE SODIUM 100 MG PO CAPS: 100.0000 mg | ORAL_CAPSULE | Freq: Two times a day (BID) | ORAL | 0 refills | Status: DC

## 2017-06-13 MED ORDER — LIDOCAINE HCL (CARDIAC) 20 MG/ML IV SOLN
INTRAVENOUS | Status: DC | PRN
  Administered 2017-06-13: 40 mg via INTRATRACHEAL

## 2017-06-13 MED ORDER — KETOROLAC TROMETHAMINE 15 MG/ML IJ SOLN: 7.5000 mg | Freq: Four times a day (QID) | INTRAMUSCULAR | Status: DC | PRN

## 2017-06-13 MED ORDER — MENTHOL 3 MG MT LOZG: 1.0000 | LOZENGE | OROMUCOSAL | Status: DC | PRN

## 2017-06-13 MED ORDER — CELECOXIB 200 MG PO CAPS
200.0000 mg | ORAL_CAPSULE | Freq: Two times a day (BID) | ORAL | Status: DC
  Administered 2017-06-13 – 2017-06-15 (×5): 200 mg via ORAL
  Filled 2017-06-13 (×5): qty 1

## 2017-06-13 MED ORDER — POLYETHYLENE GLYCOL 3350 17 G PO PACK: 17.0000 g | PACK | Freq: Every day | ORAL | Status: DC | PRN

## 2017-06-13 MED ORDER — INSULIN ASPART 100 UNIT/ML ~~LOC~~ SOLN
0.0000 [IU] | Freq: Three times a day (TID) | SUBCUTANEOUS | Status: DC
  Administered 2017-06-14 (×2): 2 [IU] via SUBCUTANEOUS
  Administered 2017-06-14: 12 [IU] via SUBCUTANEOUS
  Administered 2017-06-15: 4 [IU] via SUBCUTANEOUS

## 2017-06-13 MED ORDER — METHOCARBAMOL 500 MG PO TABS: 500.0000 mg | ORAL_TABLET | Freq: Four times a day (QID) | ORAL | Status: DC | PRN

## 2017-06-13 MED ORDER — DOCUSATE SODIUM 100 MG PO CAPS
100.0000 mg | ORAL_CAPSULE | Freq: Two times a day (BID) | ORAL | Status: DC
  Administered 2017-06-13 – 2017-06-15 (×4): 100 mg via ORAL
  Filled 2017-06-13 (×4): qty 1

## 2017-06-13 MED ORDER — OXYCODONE HCL 5 MG/5ML PO SOLN: 5.0000 mg | Freq: Once | ORAL | Status: DC | PRN

## 2017-06-13 MED ORDER — FLEET ENEMA 7-19 GM/118ML RE ENEM: 1.0000 | ENEMA | Freq: Once | RECTAL | Status: DC | PRN

## 2017-06-13 MED ORDER — METFORMIN HCL 500 MG PO TABS
1000.0000 mg | ORAL_TABLET | Freq: Every day | ORAL | Status: DC
  Administered 2017-06-13 – 2017-06-14 (×2): 1000 mg via ORAL
  Filled 2017-06-13 (×2): qty 2

## 2017-06-13 MED ORDER — BUPIVACAINE-EPINEPHRINE (PF) 0.25% -1:200000 IJ SOLN
INTRAMUSCULAR | Status: AC
  Filled 2017-06-13: qty 30

## 2017-06-13 MED ORDER — KETOROLAC TROMETHAMINE 30 MG/ML IJ SOLN
INTRAMUSCULAR | Status: DC | PRN
  Administered 2017-06-13: 30 mg via INTRAVENOUS

## 2017-06-13 MED ORDER — FENTANYL CITRATE (PF) 250 MCG/5ML IJ SOLN
INTRAMUSCULAR | Status: AC
  Filled 2017-06-13: qty 5

## 2017-06-13 MED ORDER — HYDROCODONE-ACETAMINOPHEN 5-325 MG PO TABS: 1.0000 | ORAL_TABLET | ORAL | 0 refills | Status: DC | PRN

## 2017-06-13 MED ORDER — BUPIVACAINE LIPOSOME 1.3 % IJ SUSP
20.0000 mL | INTRAMUSCULAR | Status: DC
  Filled 2017-06-13: qty 20

## 2017-06-13 MED ORDER — SODIUM CHLORIDE FLUSH 0.9 % IV SOLN
INTRAVENOUS | Status: DC | PRN
  Administered 2017-06-13: 30 mL via INTRAVENOUS

## 2017-06-13 MED ORDER — SELEGILINE HCL 5 MG PO TABS
5.0000 mg | ORAL_TABLET | Freq: Two times a day (BID) | ORAL | Status: DC
  Administered 2017-06-13 – 2017-06-15 (×4): 5 mg via ORAL
  Filled 2017-06-13 (×5): qty 1

## 2017-06-13 MED ORDER — CARBIDOPA-LEVODOPA ER 25-100 MG PO TBCR
1.5000 | EXTENDED_RELEASE_TABLET | Freq: Three times a day (TID) | ORAL | Status: DC
  Administered 2017-06-13 – 2017-06-15 (×6): 1.5 via ORAL
  Filled 2017-06-13 (×7): qty 1.5

## 2017-06-13 MED ORDER — CHLORHEXIDINE GLUCONATE 4 % EX LIQD: 60.0000 mL | Freq: Once | CUTANEOUS | Status: DC

## 2017-06-13 MED ORDER — OMEPRAZOLE 20 MG PO CPDR: 20.0000 mg | DELAYED_RELEASE_CAPSULE | Freq: Every day | ORAL | 0 refills | Status: DC

## 2017-06-13 MED ORDER — ACETAMINOPHEN 650 MG RE SUPP: 650.0000 mg | Freq: Four times a day (QID) | RECTAL | Status: DC | PRN

## 2017-06-13 MED ORDER — PROPOFOL 1000 MG/100ML IV EMUL
INTRAVENOUS | Status: AC
  Filled 2017-06-13: qty 100

## 2017-06-13 MED ORDER — METHOCARBAMOL 1000 MG/10ML IJ SOLN
500.0000 mg | Freq: Four times a day (QID) | INTRAVENOUS | Status: DC | PRN
  Filled 2017-06-13: qty 5

## 2017-06-13 MED ORDER — PROPOFOL 500 MG/50ML IV EMUL
INTRAVENOUS | Status: DC | PRN
  Administered 2017-06-13: 25 ug/kg/min via INTRAVENOUS

## 2017-06-13 MED ORDER — DEXAMETHASONE SODIUM PHOSPHATE 10 MG/ML IJ SOLN
10.0000 mg | Freq: Once | INTRAMUSCULAR | Status: AC
  Administered 2017-06-14: 10 mg via INTRAVENOUS
  Filled 2017-06-13: qty 1

## 2017-06-13 MED ORDER — METOCLOPRAMIDE HCL 5 MG PO TABS: 5.0000 mg | ORAL_TABLET | Freq: Three times a day (TID) | ORAL | Status: DC | PRN

## 2017-06-13 MED ORDER — KETOROLAC TROMETHAMINE 15 MG/ML IJ SOLN
7.5000 mg | Freq: Four times a day (QID) | INTRAMUSCULAR | Status: DC
  Administered 2017-06-13: 7.5 mg via INTRAVENOUS
  Filled 2017-06-13: qty 1

## 2017-06-13 MED ORDER — MIDAZOLAM HCL 2 MG/2ML IJ SOLN
INTRAMUSCULAR | Status: AC
Start: 2017-06-13 — End: 2017-06-13
  Filled 2017-06-13: qty 2

## 2017-06-13 MED ORDER — ASPIRIN EC 325 MG PO TBEC: 325.0000 mg | DELAYED_RELEASE_TABLET | Freq: Every day | ORAL | 0 refills | Status: DC

## 2017-06-13 MED ORDER — HYDROMORPHONE HCL 1 MG/ML IJ SOLN: 0.2000 mg | INTRAMUSCULAR | Status: DC | PRN

## 2017-06-13 MED ORDER — HYDROMORPHONE HCL 1 MG/ML IJ SOLN: 0.2500 mg | INTRAMUSCULAR | Status: DC | PRN

## 2017-06-13 MED ORDER — 0.9 % SODIUM CHLORIDE (POUR BTL) OPTIME
TOPICAL | Status: DC | PRN
  Administered 2017-06-13: 1000 mL

## 2017-06-13 MED ORDER — HYDROCODONE-ACETAMINOPHEN 5-325 MG PO TABS
1.0000 | ORAL_TABLET | ORAL | Status: DC | PRN
  Administered 2017-06-13 – 2017-06-14 (×4): 1 via ORAL
  Filled 2017-06-13 (×4): qty 1

## 2017-06-13 MED ORDER — ONDANSETRON HCL 4 MG PO TABS: 4.0000 mg | ORAL_TABLET | Freq: Four times a day (QID) | ORAL | Status: DC | PRN

## 2017-06-13 MED ORDER — SODIUM CHLORIDE 0.9 % IJ SOLN
INTRAMUSCULAR | Status: AC
  Filled 2017-06-13: qty 20

## 2017-06-13 MED ORDER — LOSARTAN POTASSIUM 50 MG PO TABS
50.0000 mg | ORAL_TABLET | Freq: Every day | ORAL | Status: DC
  Administered 2017-06-13 – 2017-06-14 (×2): 50 mg via ORAL
  Filled 2017-06-13 (×2): qty 1

## 2017-06-13 MED ORDER — PHENYLEPHRINE HCL 10 MG/ML IJ SOLN
INTRAMUSCULAR | Status: DC | PRN
  Administered 2017-06-13 (×2): 80 ug via INTRAVENOUS

## 2017-06-13 MED ORDER — LACTATED RINGERS IV SOLN
INTRAVENOUS | Status: DC
  Administered 2017-06-13 – 2017-06-14 (×2): via INTRAVENOUS

## 2017-06-13 MED ORDER — METOCLOPRAMIDE HCL 5 MG/ML IJ SOLN: 5.0000 mg | Freq: Three times a day (TID) | INTRAMUSCULAR | Status: DC | PRN

## 2017-06-13 MED ORDER — EZETIMIBE 10 MG PO TABS
10.0000 mg | ORAL_TABLET | Freq: Every day | ORAL | Status: DC
  Administered 2017-06-13 – 2017-06-15 (×3): 10 mg via ORAL
  Filled 2017-06-13 (×3): qty 1

## 2017-06-13 MED ORDER — PROPOFOL 10 MG/ML IV BOLUS
INTRAVENOUS | Status: AC
  Filled 2017-06-13: qty 20

## 2017-06-13 MED ORDER — ONDANSETRON HCL 4 MG/2ML IJ SOLN: 4.0000 mg | Freq: Four times a day (QID) | INTRAMUSCULAR | Status: DC | PRN

## 2017-06-13 MED ORDER — OXYCODONE HCL 5 MG PO TABS: 5.0000 mg | ORAL_TABLET | Freq: Once | ORAL | Status: DC | PRN

## 2017-06-13 MED ORDER — SORBITOL 70 % SOLN: 30.0000 mL | Freq: Every day | Status: DC | PRN

## 2017-06-13 MED ORDER — BUPIVACAINE IN DEXTROSE 0.75-8.25 % IT SOLN
INTRATHECAL | Status: DC | PRN
  Administered 2017-06-13: 2 mL via INTRATHECAL

## 2017-06-13 MED ORDER — BUPIVACAINE-EPINEPHRINE 0.25% -1:200000 IJ SOLN
INTRAMUSCULAR | Status: DC | PRN
  Administered 2017-06-13: 30 mL

## 2017-06-13 MED ORDER — MIDAZOLAM HCL 5 MG/5ML IJ SOLN
INTRAMUSCULAR | Status: DC | PRN
  Administered 2017-06-13: 1 mg via INTRAVENOUS

## 2017-06-13 MED ORDER — METHOCARBAMOL 500 MG PO TABS: 500.0000 mg | ORAL_TABLET | Freq: Four times a day (QID) | ORAL | 0 refills | Status: DC | PRN

## 2017-06-13 MED ORDER — CEFAZOLIN SODIUM-DEXTROSE 2-4 GM/100ML-% IV SOLN
2.0000 g | Freq: Four times a day (QID) | INTRAVENOUS | Status: AC
  Administered 2017-06-13 (×2): 2 g via INTRAVENOUS
  Filled 2017-06-13 (×2): qty 100

## 2017-06-13 MED ORDER — ONDANSETRON HCL 4 MG PO TABS: 4.0000 mg | ORAL_TABLET | Freq: Three times a day (TID) | ORAL | 0 refills | Status: DC | PRN

## 2017-06-13 MED ORDER — PROMETHAZINE HCL 25 MG/ML IJ SOLN: 6.2500 mg | INTRAMUSCULAR | Status: DC | PRN

## 2017-06-13 MED ORDER — ASPIRIN EC 325 MG PO TBEC
325.0000 mg | DELAYED_RELEASE_TABLET | Freq: Every day | ORAL | Status: DC
  Administered 2017-06-14 – 2017-06-15 (×2): 325 mg via ORAL
  Filled 2017-06-13 (×2): qty 1

## 2017-06-13 MED ORDER — DIPHENHYDRAMINE HCL 12.5 MG/5ML PO ELIX: 12.5000 mg | ORAL_SOLUTION | ORAL | Status: DC | PRN

## 2017-06-13 MED ORDER — INSULIN ASPART 100 UNIT/ML ~~LOC~~ SOLN
0.0000 [IU] | SUBCUTANEOUS | Status: DC
  Administered 2017-06-13: 2 [IU] via SUBCUTANEOUS

## 2017-06-13 MED ORDER — ACETAMINOPHEN 325 MG PO TABS: 650.0000 mg | ORAL_TABLET | Freq: Four times a day (QID) | ORAL | Status: DC | PRN

## 2017-06-13 MED ORDER — FENTANYL CITRATE (PF) 250 MCG/5ML IJ SOLN
INTRAMUSCULAR | Status: DC | PRN
  Administered 2017-06-13: 50 ug via INTRAVENOUS

## 2017-06-13 MED ORDER — SENNA 8.6 MG PO TABS
1.0000 | ORAL_TABLET | Freq: Two times a day (BID) | ORAL | Status: DC
  Administered 2017-06-13 – 2017-06-15 (×4): 8.6 mg via ORAL
  Filled 2017-06-13 (×4): qty 1

## 2017-06-13 MED ORDER — PHENOL 1.4 % MT LIQD: 1.0000 | OROMUCOSAL | Status: DC | PRN

## 2017-06-13 SURGICAL SUPPLY — 48 items
BAG DECANTER FOR FLEXI CONT (MISCELLANEOUS) ×2 IMPLANT
BLADE SAG 18X100X1.27 (BLADE) ×2 IMPLANT
CAPT HIP TOTAL 2 ×2 IMPLANT
CLOSURE STERI-STRIP 1/2X4 (GAUZE/BANDAGES/DRESSINGS) ×1
CLSR STERI-STRIP ANTIMIC 1/2X4 (GAUZE/BANDAGES/DRESSINGS) ×2 IMPLANT
COVER PERINEAL POST (MISCELLANEOUS) ×3 IMPLANT
COVER SURGICAL LIGHT HANDLE (MISCELLANEOUS) ×3 IMPLANT
DRAPE C-ARM 42X72 X-RAY (DRAPES) ×3 IMPLANT
DRAPE STERI IOBAN 125X83 (DRAPES) ×3 IMPLANT
DRAPE U-SHAPE 47X51 STRL (DRAPES) ×4 IMPLANT
DRSG MEPILEX BORDER 4X8 (GAUZE/BANDAGES/DRESSINGS) ×3 IMPLANT
DURAPREP 26ML APPLICATOR (WOUND CARE) ×3 IMPLANT
ELECT BLADE 4.0 EZ CLEAN MEGAD (MISCELLANEOUS) ×3
ELECT REM PT RETURN 9FT ADLT (ELECTROSURGICAL) ×3
ELECTRODE BLDE 4.0 EZ CLN MEGD (MISCELLANEOUS) ×1 IMPLANT
ELECTRODE REM PT RTRN 9FT ADLT (ELECTROSURGICAL) ×1 IMPLANT
FACESHIELD WRAPAROUND (MASK) ×6 IMPLANT
FACESHIELD WRAPAROUND OR TEAM (MASK) ×2 IMPLANT
GLOVE BIO SURGEON STRL SZ7.5 (GLOVE) ×10 IMPLANT
GLOVE BIOGEL PI IND STRL 8 (GLOVE) ×2 IMPLANT
GLOVE BIOGEL PI INDICATOR 8 (GLOVE) ×6
GOWN STRL REUS W/ TWL LRG LVL3 (GOWN DISPOSABLE) ×2 IMPLANT
GOWN STRL REUS W/TWL LRG LVL3 (GOWN DISPOSABLE) ×9
KIT BASIN OR (CUSTOM PROCEDURE TRAY) ×3 IMPLANT
KIT ROOM TURNOVER OR (KITS) ×3 IMPLANT
MANIFOLD NEPTUNE II (INSTRUMENTS) ×3 IMPLANT
NDL SAFETY ECLIPSE 18X1.5 (NEEDLE) IMPLANT
NEEDLE HYPO 18GX1.5 SHARP (NEEDLE) ×3
NEEDLE HYPO 22GX1.5 SAFETY (NEEDLE) ×3 IMPLANT
NS IRRIG 1000ML POUR BTL (IV SOLUTION) ×3 IMPLANT
PACK TOTAL JOINT (CUSTOM PROCEDURE TRAY) ×3 IMPLANT
PAD ARMBOARD 7.5X6 YLW CONV (MISCELLANEOUS) ×5 IMPLANT
SPONGE LAP 18X18 X RAY DECT (DISPOSABLE) ×2 IMPLANT
SUT MNCRL AB 4-0 PS2 18 (SUTURE) ×3 IMPLANT
SUT MON AB 2-0 CT1 36 (SUTURE) ×3 IMPLANT
SUT VIC AB 0 CT1 27 (SUTURE) ×6
SUT VIC AB 0 CT1 27XBRD ANBCTR (SUTURE) ×1 IMPLANT
SUT VIC AB 1 CT1 27 (SUTURE) ×3
SUT VIC AB 1 CT1 27XBRD ANBCTR (SUTURE) ×1 IMPLANT
SYR 50ML LL SCALE MARK (SYRINGE) ×3 IMPLANT
SYR BULB IRRIGATION 50ML (SYRINGE) ×3 IMPLANT
SYRINGE 20CC LL (MISCELLANEOUS) IMPLANT
TOWEL OR 17X24 6PK STRL BLUE (TOWEL DISPOSABLE) ×3 IMPLANT
TOWEL OR 17X26 10 PK STRL BLUE (TOWEL DISPOSABLE) ×3 IMPLANT
TRAY CATH 16FR W/PLASTIC CATH (SET/KITS/TRAYS/PACK) IMPLANT
TRAY FOLEY W/METER SILVER 16FR (SET/KITS/TRAYS/PACK) ×2 IMPLANT
WATER STERILE IRR 1000ML POUR (IV SOLUTION) ×3 IMPLANT
YANKAUER SUCT BULB TIP NO VENT (SUCTIONS) ×6 IMPLANT

## 2017-06-13 NOTE — Discharge Instructions (Signed)

## 2017-06-13 NOTE — Op Note (Signed)
06/13/2017  9:10 AM  PATIENT:  KAYNEN MINNER   MRN: [id number]  PRE-OPERATIVE DIAGNOSIS:  OA LEFT HIP  POST-OPERATIVE DIAGNOSIS:  OA LEFT HIP  PROCEDURE:  Procedure(s): TOTAL HIP ARTHROPLASTY ANTERIOR APPROACH  PREOPERATIVE INDICATIONS:    DEVYNN SCHEFF is an 66 y.o. male who has a diagnosis of <principal problem not specified> and elected for surgical management after failing conservative treatment.  The risks benefits and alternatives were discussed with the patient including but not limited to the risks of nonoperative treatment, versus surgical intervention including infection, bleeding, nerve injury, periprosthetic fracture, the need for revision surgery, dislocation, leg length discrepancy, blood clots, cardiopulmonary complications, morbidity, mortality, among others, and they were willing to proceed.     OPERATIVE REPORT     SURGEON:   Norinne Jeane, Ernesta Amble, MD    ASSISTANT:  Roxan Hockey, PA-C, he was present and scrubbed throughout the case, critical for completion in a timely fashion, and for retraction, instrumentation, and closure.     ANESTHESIA:  General    COMPLICATIONS:  None.     COMPONENTS:  Stryker acolade fit femur size 4 with a 36 mm +2.5 head ball and a PSL acetabular shell size 52 with a  polyethylene liner    PROCEDURE IN DETAIL:   The patient was met in the holding area and  identified.  The appropriate hip was identified and marked at the operative site.  The patient was then transported to the OR  and  placed under anesthesia per that record.  At that point, the patient was  placed in the supine position and  secured to the operating room table and all bony prominences padded. He received pre-operative antibiotics    The operative lower extremity was prepped from the iliac crest to the distal leg.  Sterile draping was performed.  Time out was performed prior to incision.      Skin incision was made just 2 cm lateral to the ASIS  extending in line  with the tensor fascia lata. Electrocautery was used to control all bleeders. I dissected down sharply to the fascia of the tensor fascia lata was confirmed that the muscle fibers beneath were running posteriorly. I then incised the fascia over the superficial tensor fascia lata in line with the incision. The fascia was elevated off the anterior aspect of the muscle the muscle was retracted posteriorly and protected throughout the case. I then used electrocautery to incise the tensor fascia lata fascia control and all bleeders. Immediately visible was the fat over top of the anterior neck and capsule.  I removed the anterior fat from the capsule and elevated the rectus muscle off of the anterior capsule. I then removed a large time of capsule. The retractors were then placed over the anterior acetabulum as well as around the superior and inferior neck.  I then removed a section of the femoral neck and a napkin ring fashion. Then used the power course to remove the femoral head from the acetabulum and thoroughly irrigated the acetabulum. I sized the femoral head.    I then exposed the deep acetabulum, cleared out any tissue including the ligamentum teres.   After adequate visualization, I excised the labrum, and then sequentially reamed.  I then impacted the acetabular implant into place using fluoroscopy for guidance.  Appropriate version and inclination was confirmed clinically matching their bony anatomy, and with fluoroscopy.  I placed a 20 mm screw in the posterior/superio position with an excellent bite.  I then placed the polyethylene liner in place  I then adducted the leg and released the external rotators from the posterior femur allowing it to be easily delivered up lateral and anterior to the acetabulum for preparation of the femoral canal.    I then prepared the proximal femur using the cookie-cutter and then sequentially reamed and broached.  A trial broach, neck, and head was  utilized, and I reduced the hip and used floroscopy to assess the neck length and femoral implant.  I then impacted the femoral prosthesis into place into the appropriate version. The hip was then reduced and fluoroscopy confirmed appropriate position. Leg lengths were restored.  I then irrigated the hip copiously again with, and repaired the fascia with Vicryl, followed by monocryl for the subcutaneous tissue, Monocryl for the skin, Steri-Strips and sterile gauze. The patient was then awakened and returned to PACU in stable and satisfactory condition. There were no complications.  POST OPERATIVE PLAN: WBAT, DVT px: SCD's/TED, ambulation and chemical dvt px  Macarthur Lorusso, MD Orthopedic Surgeon [phone]     

## 2017-06-13 NOTE — Anesthesia Procedure Notes (Signed)
Spinal  Patient location during procedure: OR Start time: 06/13/2017 7:43 AM End time: 06/13/2017 7:48 AM Staffing Anesthesiologist: Anitra LauthMILLER, Benjamyn Hestand RAY Performed: anesthesiologist  Preanesthetic Checklist Completed: patient identified, site marked, surgical consent, pre-op evaluation, timeout performed, IV checked, risks and benefits discussed and monitors and equipment checked Spinal Block Patient position: sitting Prep: DuraPrep Patient monitoring: heart rate, continuous pulse ox and blood pressure Approach: midline Location: L3-4 Injection technique: single-shot Needle Needle type: Quincke  Needle gauge: 22 G Needle length: 9 cm

## 2017-06-13 NOTE — Anesthesia Postprocedure Evaluation (Signed)
Anesthesia Post Note  Patient: Jesse Escobar  Procedure(s) Performed: Procedure(s) (LRB): TOTAL HIP ARTHROPLASTY ANTERIOR APPROACH (Left)     Patient location during evaluation: PACU Anesthesia Type: Spinal Level of consciousness: oriented and awake and alert Pain management: pain level controlled Vital Signs Assessment: post-procedure vital signs reviewed and stable Respiratory status: spontaneous breathing and respiratory function stable Cardiovascular status: blood pressure returned to baseline and stable Postop Assessment: no headache and no backache Anesthetic complications: no    Last Vitals:  Vitals:   06/13/17 1105 06/13/17 1120  BP: 133/86 124/79  Pulse: (!) 50 (!) 51  Resp: 15 18  Temp:      Last Pain:  Vitals:   06/13/17 1120  TempSrc:   PainSc: 0-No pain    LLE Motor Response: Purposeful movement;Responds to commands (06/13/17 1120) LLE Sensation: Decreased (06/13/17 1120) RLE Motor Response: Purposeful movement;Responds to commands (06/13/17 1120) RLE Sensation: Decreased (06/13/17 1120) L Sensory Level: L5-Outer lower leg, top of foot, great toe (06/13/17 1120) R Sensory Level: L5-Outer lower leg, top of foot, great toe (06/13/17 1120)  Lowella CurbWarren Ray Caillou Minus

## 2017-06-13 NOTE — Interval H&P Note (Signed)
History and Physical Interval Note:  06/13/2017 7:25 AM  Lemoine P Bergey  has presented today for surgery, with the diagnosis of OA LEFT HIP  The various methods of treatment have been discussed with the patient and family. After consideration of risks, benefits and other options for treatment, the patient has consented to  Procedure(s): TOTAL HIP ARTHROPLASTY ANTERIOR APPROACH (Left) as a surgical intervention .  The patient's history has been reviewed, patient examined, no change in status, stable for surgery.  I have reviewed the patient's chart and labs.  Questions were answered to the patient's satisfaction.     Frederika Hukill D

## 2017-06-13 NOTE — Evaluation (Signed)
Physical Therapy Evaluation Patient Details Name: Jesse Escobar MRN: [id number] DOB: 04/04/1951 Today's Date: 06/13/2017   History of Present Illness  Pt is 66 y/o male s/p elective L THA, direct anterior approach. PMH includes parkinson's disease, HTN, DM, CKD, anxiety/depression.   Clinical Impression  Pt is s/p surgery above with deficits below. PTA, pt was needing assist with ADLs from wife and was using rollator for mobility. Pt with parkinson's disease at baseline. Upon evaluation, pt limited by post op pain and weakness, decreased balance, and increased time required for motor planning. Min guard to max A for mobility. Required max A for bed mobility, as pt reports it takes him a while to get started after laying flat. Current plan is to d/c home. Will need DME below to ensure safety with functional mobility. Will continue to follow to progress mobility and update recommendations as necessary.     Follow Up Recommendations DC plan and follow up therapy as arranged by surgeon;Supervision/Assistance - 24 hour    Equipment Recommendations  Rolling walker with 5" wheels;3in1 (PT);Hospital bed    Recommendations for Other Services       Precautions / Restrictions Precautions Precautions: Fall Precaution Comments: Reviewed THA exercise handout with pt.  Restrictions Weight Bearing Restrictions: Yes LLE Weight Bearing: Weight bearing as tolerated      Mobility  Bed Mobility Overal bed mobility: Needs Assistance Bed Mobility: Supine to Sit     Supine to sit: Max assist;HOB elevated     General bed mobility comments: Max A to scoot hips to EOB, secondary to delayed motor response. Pt reports he is usually slow to get to going when he has been laying on his back. Required use of bed pad to scoot hips to EOB. Use of bed rails and elevated HOB for trunk elevation.   Transfers Overall transfer level: Needs assistance Equipment used: Rolling walker (2 wheeled) Transfers: Sit  to/from Stand Sit to Stand: Min assist;From elevated surface         General transfer comment: Min A for lift assist and steadying once standing. Required verbal cues for hand placement. Extended time required secondary to delayed motor response from parkinsons disease.   Ambulation/Gait Ambulation/Gait assistance: Min guard Ambulation Distance (Feet): 10 Feet Assistive device: Rolling walker (2 wheeled) Gait Pattern/deviations: Step-to pattern;Step-through pattern;Decreased step length - right;Decreased step length - left;Decreased weight shift to left;Antalgic Gait velocity: Decreased Gait velocity interpretation: Below normal speed for age/gender General Gait Details: Very slow, antalgic gait. Pt reports pain has improved, however. Pt requiring extended time for short distance secondary to parkinson's. Verbal cues for sequencing with use of RW.   Stairs            Wheelchair Mobility    Modified Rankin (Stroke Patients Only)       Balance Overall balance assessment: Needs assistance Sitting-balance support: No upper extremity supported;Feet supported Sitting balance-Leahy Scale: Good     Standing balance support: Bilateral upper extremity supported;During functional activity Standing balance-Leahy Scale: Poor Standing balance comment: Reliant on RW for stability                              Pertinent Vitals/Pain Pain Assessment: 0-10 Pain Score: 6  Pain Location: L hip Pain Descriptors / Indicators: Aching;Operative site guarding;Sore Pain Intervention(s): Limited activity within patient's tolerance;Monitored during session;Repositioned    Home Living Family/patient expects to be discharged to:: Private residence Living Arrangements: Spouse/significant other Available Help  at Discharge: Family;Available 24 hours/day Type of Home: House Home Access: Stairs to enter Entrance Stairs-Rails: Doctor, general practice of Steps: 4 Home Layout:  Two level (talked about getting hospital bed to keep downstairs ) Home Equipment: Walker - 4 wheels      Prior Function Level of Independence: Needs assistance   Gait / Transfers Assistance Needed: Used rollator at baseline. Wife had to assist with ambulation if he didn't use rollator  ADL's / Homemaking Assistance Needed: Needed assist from wife.         Hand Dominance   Dominant Hand: Right    Extremity/Trunk Assessment   Upper Extremity Assessment Upper Extremity Assessment: Defer to OT evaluation    Lower Extremity Assessment Lower Extremity Assessment: LLE deficits/detail LLE Deficits / Details: Sensory in tact. Deficits consistent with post op pain and weakness. Able to perform exercise below.     Cervical / Trunk Assessment Cervical / Trunk Assessment: Normal  Communication   Communication: No difficulties  Cognition Arousal/Alertness: Awake/alert Behavior During Therapy: Flat affect Overall Cognitive Status: Within Functional Limits for tasks assessed                                        General Comments General comments (skin integrity, edema, etc.): Pt's wife present during session. Per MD notes, will likely need hospital bed, as pt's bedroom on 2nd floor.     Exercises Total Joint Exercises Ankle Circles/Pumps: AROM;Both;10 reps;Supine Quad Sets: AROM;Left;10 reps;Supine Short Arc Quad: AROM;Left;10 reps;Supine Heel Slides: AAROM;Left;10 reps;Supine Hip ABduction/ADduction: AAROM;Left;10 reps;Supine   Assessment/Plan    PT Assessment Patient needs continued PT services  PT Problem List Decreased strength;Decreased range of motion;Decreased activity tolerance;Decreased balance;Decreased mobility;Decreased coordination;Decreased knowledge of use of DME;Decreased knowledge of precautions;Pain       PT Treatment Interventions DME instruction;Gait training;Stair training;Functional mobility training;Therapeutic activities;Therapeutic  exercise;Balance training;Neuromuscular re-education;Patient/family education    PT Goals (Current goals can be found in the Care Plan section)  Acute Rehab PT Goals Patient Stated Goal: to go home  PT Goal Formulation: With patient Time For Goal Achievement: 06/20/17 Potential to Achieve Goals: Good    Frequency 7X/week   Barriers to discharge        Co-evaluation               AM-PAC PT "6 Clicks" Daily Activity  Outcome Measure Difficulty turning over in bed (including adjusting bedclothes, sheets and blankets)?: Total Difficulty moving from lying on back to sitting on the side of the bed? : Total Difficulty sitting down on and standing up from a chair with arms (e.g., wheelchair, bedside commode, etc,.)?: Total Help needed moving to and from a bed to chair (including a wheelchair)?: A Little Help needed walking in hospital room?: A Little Help needed climbing 3-5 steps with a railing? : A Lot 6 Click Score: 11    End of Session Equipment Utilized During Treatment: Gait belt Activity Tolerance: Patient tolerated treatment well Patient left: in chair;with call bell/phone within reach;with family/visitor present Nurse Communication: Mobility status PT Visit Diagnosis: Other abnormalities of gait and mobility (R26.89);Pain;Other symptoms and signs involving the nervous system (R29.898) Pain - Right/Left: Left Pain - part of body: Hip    Time: 1610-9604 PT Time Calculation (min) (ACUTE ONLY): 30 min   Charges:   PT Evaluation $PT Eval Low Complexity: 1 Procedure PT Treatments $Gait Training: 8-22 mins   PT  G Codes:        Gladys DammeBrittany Valborg Friar, PT, DPT  Acute Rehabilitation Services  Pager: [phone]   Lehman PromBrittany S Cobie Marcoux 06/13/2017, 5:17 PM

## 2017-06-13 NOTE — Anesthesia Preprocedure Evaluation (Signed)
Anesthesia Evaluation  Patient identified by MRN, date of birth, ID band Patient awake    Reviewed: Allergy & Precautions, NPO status , Patient's Chart, lab work & pertinent test results, reviewed documented beta blocker date and time   Airway Mallampati: II  TM Distance: >3 FB Neck ROM: Full    Dental no notable dental hx.    Pulmonary neg pulmonary ROS,    Pulmonary exam normal breath sounds clear to auscultation       Cardiovascular hypertension, Pt. on medications and Pt. on home beta blockers negative cardio ROS Normal cardiovascular exam Rhythm:Regular Rate:Normal     Neuro/Psych Depression Parkinson's negative psych ROS   GI/Hepatic negative GI ROS, Neg liver ROS,   Endo/Other  negative endocrine ROSdiabetes, Type 2, Oral Hypoglycemic Agents  Renal/GU Renal InsufficiencyRenal diseasenegative Renal ROS  negative genitourinary   Musculoskeletal negative musculoskeletal ROS (+) Arthritis ,   Abdominal   Peds negative pediatric ROS (+)  Hematology negative hematology ROS (+)   Anesthesia Other Findings   Reproductive/Obstetrics negative OB ROS                             Anesthesia Physical Anesthesia Plan  ASA: III  Anesthesia Plan: Spinal   Post-op Pain Management:    Induction: Intravenous  PONV Risk Score and Plan: 1 and Ondansetron and Propofol  Airway Management Planned: Simple Face Mask  Additional Equipment:   Intra-op Plan:   Post-operative Plan:   Informed Consent: I have reviewed the patients History and Physical, chart, labs and discussed the procedure including the risks, benefits and alternatives for the proposed anesthesia with the patient or authorized representative who has indicated his/her understanding and acceptance.   Dental advisory given  Plan Discussed with: CRNA  Anesthesia Plan Comments:         Anesthesia Quick Evaluation

## 2017-06-13 NOTE — Transfer of Care (Signed)
Immediate Anesthesia Transfer of Care Note  Patient: Solana DesanctisAlonza P Ciesla  Procedure(s) Performed: Procedure(s): TOTAL HIP ARTHROPLASTY ANTERIOR APPROACH (Left)  Patient Location: PACU  Anesthesia Type:Spinal  Level of Consciousness: awake, alert  and oriented  Airway & Oxygen Therapy: Patient Spontanous Breathing and Patient connected to nasal cannula oxygen  Post-op Assessment: Report given to RN and Post -op Vital signs reviewed and stable  Post vital signs: Reviewed and stable  Last Vitals:  Vitals:   06/13/17 0635 06/13/17 0950  BP: (!) 150/88 112/78  Pulse: 66 (!) 57  Resp: 18 15  Temp: 36.6 C (!) 36.4 C    Last Pain:  Vitals:   06/13/17 0655  TempSrc:   PainSc: 3       Patients Stated Pain Goal: 1 (06/13/17 0655)  Complications: No apparent anesthesia complications

## 2017-06-14 ENCOUNTER — Encounter (HOSPITAL_COMMUNITY): Payer: Self-pay | Admitting: Orthopedic Surgery

## 2017-06-14 LAB — GLUCOSE, CAPILLARY
GLUCOSE-CAPILLARY: 197 mg/dL — AB (ref 65–99)
Glucose-Capillary: 124 mg/dL — ABNORMAL HIGH (ref 65–99)
Glucose-Capillary: 155 mg/dL — ABNORMAL HIGH (ref 65–99)
Glucose-Capillary: 254 mg/dL — ABNORMAL HIGH (ref 65–99)

## 2017-06-14 NOTE — Care Management Note (Addendum)
Case Management Note  Patient Details  Name:  Jesse Escobar MRN: [id number] Date of Birth: January 16, 1951  Subjective/Objective:     66 yr old gentleman s/p left total hip arthroplasty.               Action/Plan:  Case manager spoke with patient and his wife concerning discharge plan and DME needs. Patient was preoperatively setup with [facility], no changes. They are needing a hospital bed for patient. CM has requested one through [facility]. Patient's wife advised that She will be contacted by [facility] to arrange delivery of bed. Her cell # is [phone]. Patient will need ambulance transport home at discharge.  Expected Discharge Date:  06/14/17               Expected Discharge Plan:  Home w Home Health Services  In-House Referral:  NA  Discharge planning Services  CM Consult  Post Acute Care Choice:  Durable Medical Equipment, Home Health Choice offered to:  Patient  DME Arranged:  Hospital bed, 3-N-1, Walker rolling DME Agency:  [facility] [employer]  HH Arranged:  PT HH Agency:  [facility]  Status of Service:  Completed, signed off  If discussed at [employer] Meetings, dates discussed:    Additional Comments:  Durenda GuthrieBrady, Shantai Tiedeman Naomi, RN 06/14/2017, 12:24 PM

## 2017-06-14 NOTE — Progress Notes (Signed)
   Assessment: 1 Day Post-Op  S/P Procedure(s) (LRB): TOTAL HIP ARTHROPLASTY ANTERIOR APPROACH (Left) by Dr. Jewel Baizeimothy D. Eulah PontMurphy on 06/13/17  Principal Problem:   Primary osteoarthritis of left hip Active Problems:   Parkinson's disease (HCC)   Hyperlipidemia   CKD (chronic kidney disease) stage 2, GFR 60-89 ml/min   Type 2 diabetes mellitus (HCC)   Plan: Advance diet Up with therapy D/C IV fluids Incentive Spirometry Apply ice  Weight Bearing: Weight Bearing as Tolerated (WBAT)  Dressings: Maintain Mepilex.  VTE prophylaxis: Aspirin, SCDs, ambulation  Dispo: Home w/ HHPT.  Pending additional PT evaluation.  He will likely be able to discharge more quickly, avoid prolonged hospitalization, and avoid need for SNF if he gets a hospital bed at home.  They have 15 stairs up to bedrooms. -Okay to d/c to home today if hospital bed is arranged at home and is cleared by PT.  Subjective: Patient reports pain as mild. Pain controlled with PO meds.  Tolerating diet.  Urinating.  +Flatus.  No CP, SOB.  OOB in room with PT.  Objective:   VITALS:   Vitals:   06/13/17 1954 06/13/17 2058 06/14/17 0045 06/14/17 0438  BP: 120/64 125/75 118/62 (!) 142/79  Pulse: 80 76 76 75  Resp: 16 18 20 18   Temp: 97.8 F (36.6 C) 98.7 F (37.1 C) 98.4 F (36.9 C) 97.8 F (36.6 C)  TempSrc: Oral Oral Oral Oral  SpO2: 98% 97% 97% 97%  Weight: 112.5 kg (248 lb)     Height: 5\' 9"  (1.753 m)      CBC Latest Ref Rng & Units 06/02/2017 07/08/2013 12/12/2008  WBC 4.0 - 10.5 K/uL 5.4 10.7(H) 5.0  Hemoglobin 13.0 - 17.0 g/dL 84.614.9 96.214.8 95.214.9  Hematocrit 39.0 - 52.0 % 45.1 43.4 44.0  Platelets 150 - 400 K/uL 209 195 190   BMP Latest Ref Rng & Units 06/02/2017 07/08/2013 12/12/2008  Glucose 65 - 99 mg/dL 841(L100(H) 244(W192(H) 96  BUN 6 - 20 mg/dL 13 14 13   Creatinine 0.61 - 1.24 mg/dL 1.021.18 7.25(D1.59(H) 1.1  Sodium 135 - 145 mmol/L 138 141 142  Potassium 3.5 - 5.1 mmol/L 4.2 4.2 4.1  Chloride 101 - 111 mmol/L 104 104 106    CO2 22 - 32 mmol/L 24 24 29   Calcium 8.9 - 10.3 mg/dL 9.7 9.7 9.1   Intake/Output      07/17 0701 - 07/18 0700 07/18 0701 - 07/19 0700   P.O. 960 0   I.V. (mL/kg) 3170.8 (28.2) 89.6 (0.8)   IV Piggyback 100    Total Intake(mL/kg) 4230.8 (37.6) 89.6 (0.8)   Urine (mL/kg/hr) 386 (0.1) 350 (3.9)   Blood 250    Total Output 636 350   Net +3594.8 -260.4          Physical Exam: General: NAD.  Sitting upright on 3:1 on arrival.  Wife at bedside.  Pleasant, interactive. Resp: No increased wob Cardio: regular rate and rhythm ABD soft Neurologically intact MSK Neurovascularly intact Sensation intact distally Dorsiflexion/Plantar flexion, EHL, FHL intact at baseline. Incision: dressing C/D/I   Albina BilletHenry Calvin Martensen III, PA-C 06/14/2017, 7:48 AM

## 2017-06-14 NOTE — Progress Notes (Signed)
Physical Therapy Treatment Patient Details Name: Jesse Escobar MRN: [id number] DOB: 07/04/1951 Today's Date: 06/14/2017    History of Present Illness Pt is 66 y/o male s/p elective L THA, direct anterior approach. PMH includes parkinson's disease, HTN, DM, CKD, anxiety/depression.     PT Comments    Pt making steady progress with functional mobility. He continues to require max A for bed mobility but progressing from requiring mod A to min A for transfers. Pt also tolerated ambulating 10' x3 with sitting rest breaks in between. Pt would continue to benefit from skilled physical therapy services at this time while admitted and after d/c to address the below listed limitations in order to improve overall safety and independence with functional mobility.    Follow Up Recommendations  Home health PT;Supervision/Assistance - 24 hour (awaiting hospital bed delivery)     Equipment Recommendations  Rolling walker with 5" wheels;3in1 (PT);Hospital bed    Recommendations for Other Services       Precautions / Restrictions Precautions Precautions: Fall Restrictions Weight Bearing Restrictions: Yes LLE Weight Bearing: Weight bearing as tolerated    Mobility  Bed Mobility Overal bed mobility: Needs Assistance Bed Mobility: Supine to Sit     Supine to sit: Max assist;HOB elevated     General bed mobility comments: up in chair on arrival  Transfers Overall transfer level: Needs assistance Equipment used: Rolling walker (2 wheeled) Transfers: Sit to/from Stand Sit to Stand: Min assist         General transfer comment: wife present and educated on helping with transfer. pt able to advance to the front the chair and power up with wife one person (A)  Ambulation/Gait Ambulation/Gait assistance: Min assist Ambulation Distance (Feet): 10 Feet (10' x3) Assistive device: Rolling walker (2 wheeled) Gait Pattern/deviations: Step-to pattern;Step-through pattern;Decreased step length  - left;Decreased stride length;Decreased dorsiflexion - left;Shuffle;Trunk flexed Gait velocity: Decreased Gait velocity interpretation: Below normal speed for age/gender General Gait Details: min A for stability and assist with movement of RW with direction changes. pt remains very slow with gait and unable to clear L foot off of floor during swing phase   Stairs            Wheelchair Mobility    Modified Rankin (Stroke Patients Only)       Balance Overall balance assessment: Needs assistance Sitting-balance support: No upper extremity supported;Feet supported Sitting balance-Leahy Scale: Good Sitting balance - Comments: pt able to sit EOB with supervision   Standing balance support: Bilateral upper extremity supported;During functional activity Standing balance-Leahy Scale: Poor Standing balance comment: Reliant on RW for stability                             Cognition Arousal/Alertness: Awake/alert Behavior During Therapy: WFL for tasks assessed/performed Overall Cognitive Status: Within Functional Limits for tasks assessed                                        Exercises      General Comments General comments (skin integrity, edema, etc.): wife and pt educated on bathing and dressing. wife edcuated on use of flat sheet to help with bed positioning if needed      Pertinent Vitals/Pain Pain Assessment: No/denies pain    Home Living Family/patient expects to be discharged to:: Private residence Living Arrangements: Spouse/significant other Available Help at  Discharge: Family;Available 24 hours/day Type of Home: House Home Access: Stairs to enter Entrance Stairs-Rails: Right;Left Home Layout: Two level Home Equipment: Environmental consultant - 4 wheels      Prior Function Level of Independence: Needs assistance  Gait / Transfers Assistance Needed: Used rollator at baseline. Wife had to assist with ambulation if he didn't use rollator ADL's /  Homemaking Assistance Needed: Needed assist from wife.      PT Goals (current goals can now be found in the care plan section) Acute Rehab PT Goals Patient Stated Goal: to go home  PT Goal Formulation: With patient Time For Goal Achievement: 06/20/17 Potential to Achieve Goals: Good Progress towards PT goals: Progressing toward goals    Frequency    7X/week      PT Plan Current plan remains appropriate    Co-evaluation              AM-PAC PT "6 Clicks" Daily Activity  Outcome Measure  Difficulty turning over in bed (including adjusting bedclothes, sheets and blankets)?: Total Difficulty moving from lying on back to sitting on the side of the bed? : Total Difficulty sitting down on and standing up from a chair with arms (e.g., wheelchair, bedside commode, etc,.)?: Total Help needed moving to and from a bed to chair (including a wheelchair)?: A Little Help needed walking in hospital room?: A Little Help needed climbing 3-5 steps with a railing? : A Lot 6 Click Score: 11    End of Session Equipment Utilized During Treatment: Gait belt Activity Tolerance: Patient tolerated treatment well Patient left: in chair;with call bell/phone within reach;with family/visitor present;Other (comment) (OT present) Nurse Communication: Mobility status PT Visit Diagnosis: Other abnormalities of gait and mobility (R26.89);Pain;Other symptoms and signs involving the nervous system (R29.898) Pain - Right/Left: Left Pain - part of body: Hip     Time: 1132-1200 PT Time Calculation (min) (ACUTE ONLY): 28 min  Charges:  $Gait Training: 8-22 mins $Therapeutic Activity: 8-22 mins                    G Codes:       [place], [place], [place] [phone]    Jesse Escobar 06/14/2017, 1:52 PM

## 2017-06-14 NOTE — Evaluation (Signed)
Occupational Therapy Evaluation Patient Details Name: Jesse Escobar MRN: [id number] DOB: August 17, 1951 Today's Date: 06/14/2017    History of Present Illness Pt is 66 y/o male s/p elective L THA, direct anterior approach. PMH includes parkinson's disease, HTN, DM, CKD, anxiety/depression.    Clinical Impression   Patient is s/p L THA anterior direct surgery resulting in functional limitations due to the deficits listed below (see OT problem list). Pt will require Mod (A) for LB adls and wife present demonstrates ability to provide.  Patient will benefit from skilled OT acutely to increase independence and safety with ADLS to allow discharge hhot.     Follow Up Recommendations  Home health OT;Other (comment) (awaiting hospital bed delivery)    Equipment Recommendations  3 in 1 bedside commode (in room at this time)    Recommendations for Other Services       Precautions / Restrictions Precautions Precautions: Fall Restrictions Weight Bearing Restrictions: Yes LLE Weight Bearing: Weight bearing as tolerated      Mobility Bed Mobility Overal bed mobility: Needs Assistance Bed Mobility: Supine to Sit     Supine to sit: Max assist;HOB elevated     General bed mobility comments: up in chair on arrival  Transfers Overall transfer level: Needs assistance Equipment used: Rolling walker (2 wheeled) Transfers: Sit to/from Stand Sit to Stand: Min assist         General transfer comment: wife present and educated on helping with transfer. pt able to advance to the front the chair and power up with wife one person (A)    Balance Overall balance assessment: Needs assistance Sitting-balance support: No upper extremity supported;Feet supported Sitting balance-Leahy Scale: Good Sitting balance - Comments: pt able to sit EOB with supervision   Standing balance support: Bilateral upper extremity supported;During functional activity Standing balance-Leahy Scale: Poor Standing  balance comment: Reliant on RW for stability                            ADL either performed or assessed with clinical judgement   ADL Overall ADL's : Needs assistance/impaired Eating/Feeding: Set up   Grooming: Set up   Upper Body Bathing: Minimal assistance   Lower Body Bathing: Minimal assistance           Toilet Transfer: Minimal assistance           Functional mobility during ADLs: Minimal assistance General ADL Comments: wife (A) patient with transfer from bed surface and chair surface with education on positioning.    Pt educated on bathing and avoid washing directly on incision. Pt educated to use new wash cloth and towel each day. Pt educated to allow water to run across dressing and not to soak in a tub at this time. Pt advised RN will instruct on any bandages required otherwise is open to air.     Vision         Perception     Praxis      Pertinent Vitals/Pain Pain Assessment: No/denies pain     Hand Dominance Right   Extremity/Trunk Assessment Upper Extremity Assessment Upper Extremity Assessment: Overall WFL for tasks assessed   Lower Extremity Assessment Lower Extremity Assessment: Defer to PT evaluation   Cervical / Trunk Assessment Cervical / Trunk Assessment: Normal   Communication Communication Communication: No difficulties   Cognition Arousal/Alertness: Awake/alert Behavior During Therapy: WFL for tasks assessed/performed Overall Cognitive Status: Within Functional Limits for tasks assessed  General Comments  wife and pt educated on bathing and dressing. wife edcuated on use of flat sheet to help with bed positioning if needed    Exercises     Shoulder Instructions      Home Living Family/patient expects to be discharged to:: Private residence Living Arrangements: Spouse/significant other Available Help at Discharge: Family;Available 24 hours/day Type of Home:  House Home Access: Stairs to enter Entergy CorporationEntrance Stairs-Number of Steps: 4 Entrance Stairs-Rails: Right;Left Home Layout: Two level Alternate Level Stairs-Number of Steps: flight  Alternate Level Stairs-Rails: Right Bathroom Shower/Tub: Chief Strategy OfficerTub/shower unit   Bathroom Toilet: Standard     Home Equipment: Environmental consultantWalker - 4 wheels          Prior Functioning/Environment Level of Independence: Needs assistance  Gait / Transfers Assistance Needed: Used rollator at baseline. Wife had to assist with ambulation if he didn't use rollator ADL's / Homemaking Assistance Needed: Needed assist from wife.             OT Problem List: Decreased strength;Decreased activity tolerance;Impaired balance (sitting and/or standing);Decreased safety awareness;Decreased knowledge of use of DME or AE;Decreased knowledge of precautions;Obesity;Pain      OT Treatment/Interventions: Self-care/ADL training;Therapeutic exercise;DME and/or AE instruction;Therapeutic activities;Patient/family education;Balance training    OT Goals(Current goals can be found in the care plan section) Acute Rehab OT Goals Patient Stated Goal: to go home  OT Goal Formulation: With patient Time For Goal Achievement: 06/28/17 Potential to Achieve Goals: Good  OT Frequency: Min 2X/week   Barriers to D/C:            Co-evaluation              AM-PAC PT "6 Clicks" Daily Activity     Outcome Measure Help from another person eating meals?: None Help from another person taking care of personal grooming?: A Little Help from another person toileting, which includes using toliet, bedpan, or urinal?: A Little Help from another person bathing (including washing, rinsing, drying)?: A Little Help from another person to put on and taking off regular upper body clothing?: A Little Help from another person to put on and taking off regular lower body clothing?: A Lot 6 Click Score: 18   End of Session Equipment Utilized During Treatment: Gait  belt;Rolling walker Nurse Communication: Mobility status;Precautions  Activity Tolerance: Patient tolerated treatment well Patient left: in chair;with call bell/phone within reach;with family/visitor present  OT Visit Diagnosis: Unsteadiness on feet (R26.81)                Time: 1610-96041150-1211 OT Time Calculation (min): 21 min Charges:  OT General Charges $OT Visit: 1 Procedure OT Evaluation $OT Eval Moderate Complexity: 1 Procedure G-Codes:      Mateo FlowJones, Brynn   OTR/L Pager: [phone]: [phone] Office: [phone] .   Boone MasterJones, Karema Tocci B 06/14/2017, 12:29 PM

## 2017-06-14 NOTE — Progress Notes (Signed)
RN left voicemail for MD to co-sign pt's hospital bed order.

## 2017-06-14 NOTE — Discharge Summary (Signed)
Discharge Summary  Patient ID: Jesse Escobar MRN: [id number] DOB/AGE: 07-25-1951 66 y.o.  Admit date: 06/13/2017 Discharge date: 06/14/2017  Admission Diagnoses:  Primary osteoarthritis of left hip  Discharge Diagnoses:  Principal Problem:   Primary osteoarthritis of left hip Active Problems:   Parkinson's disease (HCC)   Hyperlipidemia   CKD (chronic kidney disease) stage 2, GFR 60-89 ml/min   Type 2 diabetes mellitus (HCC)   Past Medical History:  Diagnosis Date  . Anxiety and depression   . Arthritis   . Chronic low back pain   . Gait disorder 03/01/2016  . History of kidney stones   . Hyperlipidemia   . Hypertension   . Kidney stones   . Left ureteral calculus   . 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 Memory difficulties 07/17/2014  . Obesity   . Parkinson's disease (HCC)   . REM sleep behavior disorder   . Sleep behavior disorder, REM 03/01/2016  . Type 2 diabetes mellitus (HCC)     Surgeries: Procedure(s): TOTAL HIP ARTHROPLASTY ANTERIOR APPROACH on 06/13/2017   Consultants (if any):   Discharged Condition: Improved  Hospital Course: Jesse Escobar is an 66 y.o. male who was admitted 06/13/2017 with a diagnosis of Primary osteoarthritis of left hip and went to the operating room on 06/13/2017 and underwent the above named procedures.    He was given perioperative antibiotics:  Anti-infectives    Start     Dose/Rate Route Frequency Ordered Stop   06/13/17 1345  ceFAZolin (ANCEF) IVPB 2g/100 mL premix     2 g 200 mL/hr over 30 Minutes Intravenous Every 6 hours 06/13/17 1214 06/13/17 2024   06/13/17 0700  ceFAZolin (ANCEF) IVPB 2g/100 mL premix     2 g 200 mL/hr over 30 Minutes Intravenous To [facility] 06/12/17 1002 06/13/17 0738    .  He was given sequential compression devices, early ambulation, and ASA 325 mg for DVT prophylaxis.  He benefited maximally from the hospital stay and there were no complications.     Recent vital signs:  Vitals:   06/14/17 0045 06/14/17 0438  BP: 118/62 (!) 142/79  Pulse: 76 75  Resp: 20 18  Temp: 98.4 F (36.9 C) 97.8 F (36.6 C)    Recent laboratory studies:  Lab Results  Component Value Date   HGB 14.9 06/02/2017   HGB 14.8 07/08/2013   HGB 14.9 12/12/2008   Lab Results  Component Value Date   WBC 5.4 06/02/2017   PLT 209 06/02/2017   No results found for: INR Lab Results  Component Value Date   NA 138 06/02/2017   K 4.2 06/02/2017   CL 104 06/02/2017   CO2 24 06/02/2017   BUN 13 06/02/2017   CREATININE 1.18 06/02/2017   GLUCOSE 100 (H) 06/02/2017    Discharge Medications:   Allergies as of 06/14/2017      Reactions   No Known Allergies       Medication List    TAKE these medications   aspirin EC 325 MG tablet Take 1 tablet (325 mg total) by mouth daily. For 30 days post op for DVT Prophylaxis   Carbidopa-Levodopa ER 25-100 MG tablet controlled release Commonly known as:  SINEMET CR Take 1 tablet by mouth 3 (three) times daily. What changed:  how much to take   docusate sodium 100 MG capsule Commonly known as:  COLACE Take 1 capsule (100 mg total) by mouth 2 (  two) times daily. To prevent constipation while taking pain medication.   ezetimibe 10 MG tablet Commonly known as:  ZETIA Take 10 mg by mouth daily.   HYDROcodone-acetaminophen 5-325 MG tablet Commonly known as:  NORCO Take 1-2 tablets by mouth every 4 (four) hours as needed for moderate pain.   JUICE PLUS FIBRE PO Take 4 tablets by mouth daily. 2 TABLETS OF ORCHARD BLEND & 2 TABLETS OF GARDEN BLEND   losartan 50 MG tablet Commonly known as:  COZAAR Take 50 mg by mouth daily.   metFORMIN 500 MG tablet Commonly known as:  GLUCOPHAGE Take 1,000 mg by mouth at bedtime.   methocarbamol 500 MG tablet Commonly known as:  ROBAXIN Take 1 tablet (500 mg total) by mouth every 6 (six) hours as needed for muscle spasms.   naproxen sodium 220 MG tablet Commonly known  as:  ANAPROX Take 220 mg by mouth 2 (two) times daily as needed (for pain.).   omeprazole 20 MG capsule Commonly known as:  PRILOSEC Take 1 capsule (20 mg total) by mouth daily. 30 days for gastroprotection while taking Aspirin.   ondansetron 4 MG tablet Commonly known as:  ZOFRAN Take 1 tablet (4 mg total) by mouth every 8 (eight) hours as needed for nausea or vomiting.   ONE TOUCH ULTRA TEST test strip Generic drug:  glucose blood 1 each by Other route daily.   selegiline 5 MG capsule Commonly known as:  ELDEPRYL TAKE 1 CAPSULE (5 MG TOTAL) BY MOUTH 2 (TWO) TIMES DAILY. ONE IN THE MORNING AND ONE AT NOON   vitamin E 400 UNIT capsule Take 1,200 Units by mouth 3 (three) times a week.       Diagnostic Studies: Dg C-arm 1-60 Min  Result Date: 06/13/2017 CLINICAL DATA:  Intraoperative imaging for left hip replacement. EXAM: DG C-ARM 61-120 MIN; OPERATIVE RIGHT HIP WITH PELVIS COMPARISON:  None. FINDINGS: Two fluoroscopic spot views are provided of the low pelvis and left hip. Images demonstrate a left total hip arthroplasty in place. No fracture is identified. The device is located. IMPRESSION: Left hip replacement without acute abnormality. Electronically Signed   By: Drusilla Kanner M.D.   On: 06/13/2017 09:22   Dg Hip Operative Unilat W Or W/o Pelvis Right  Result Date: 06/13/2017 CLINICAL DATA:  Intraoperative imaging for left hip replacement. EXAM: DG C-ARM 61-120 MIN; OPERATIVE RIGHT HIP WITH PELVIS COMPARISON:  None. FINDINGS: Two fluoroscopic spot views are provided of the low pelvis and left hip. Images demonstrate a left total hip arthroplasty in place. No fracture is identified. The device is located. IMPRESSION: Left hip replacement without acute abnormality. Electronically Signed   By: Drusilla Kanner M.D.   On: 06/13/2017 09:22    Disposition: 01-Home or Self Care w/ HHPT.  Hospital bed delivery pending.    Follow-up Information    Sheral Apley, MD Follow up.    Specialty:  Orthopedic Surgery Contact information: [address] [place] [address] [phone]            Signed: Albina Billet III PA-C 06/14/2017, 8:02 AM

## 2017-06-14 NOTE — Progress Notes (Signed)
Physical Therapy Treatment Patient Details Name: Jesse Escobar Falkenstein MRN: [id number] DOB: 1951-07-16 Today's Date: 06/14/2017    History of Present Illness Pt is 66 y/o male s/Escobar elective L THA, direct anterior approach. PMH includes parkinson's disease, HTN, DM, CKD, anxiety/depression.     PT Comments    Pt making steady progress towards achieving his current functional goals. Pt would continue to benefit from skilled physical therapy services at this time while admitted and after d/c to address the below listed limitations in order to improve overall safety and independence with functional mobility.    Follow Up Recommendations  Home health PT;Supervision/Assistance - 24 hour     Equipment Recommendations  Rolling walker with 5" wheels;3in1 (PT);Hospital bed    Recommendations for Other Services       Precautions / Restrictions Precautions Precautions: Fall Restrictions Weight Bearing Restrictions: Yes LLE Weight Bearing: Weight bearing as tolerated    Mobility  Bed Mobility               General bed mobility comments: up in chair on arrival  Transfers Overall transfer level: Needs assistance Equipment used: Rolling walker (2 wheeled) Transfers: Sit to/from Stand Sit to Stand: Min assist         General transfer comment: increased time, cueing for technique, min A for stability and to power into full standing position  Ambulation/Gait Ambulation/Gait assistance: Min guard Ambulation Distance (Feet): 30 Feet Assistive device: Rolling walker (2 wheeled) Gait Pattern/deviations: Step-to pattern;Step-through pattern;Decreased step length - left;Decreased stride length;Decreased dorsiflexion - left;Shuffle;Trunk flexed Gait velocity: Decreased Gait velocity interpretation: Below normal speed for age/gender General Gait Details: pt with improved endurance during ambulation this session and tolerated ambulating 30' total without rest breaks, min guard for  safety   Stairs            Wheelchair Mobility    Modified Rankin (Stroke Patients Only)       Balance Overall balance assessment: Needs assistance Sitting-balance support: No upper extremity supported;Feet supported Sitting balance-Leahy Scale: Good     Standing balance support: Bilateral upper extremity supported;During functional activity Standing balance-Leahy Scale: Poor Standing balance comment: Reliant on RW for stability                             Cognition Arousal/Alertness: Awake/alert Behavior During Therapy: Flat affect Overall Cognitive Status: Within Functional Limits for tasks assessed                                        Exercises Total Joint Exercises Hip ABduction/ADduction: AROM;Strengthening;Left;10 reps;Standing Marching in Standing: AROM;Strengthening;Both;20 reps;Standing General Exercises - Lower Extremity Mini-Sqauts: AROM;Strengthening;Both;10 reps;Standing    General Comments        Pertinent Vitals/Pain Pain Assessment: 0-10 Pain Score: 3  Pain Location: L hip Pain Descriptors / Indicators: Aching;Operative site guarding;Sore Pain Intervention(s): Monitored during session;Repositioned    Home Living                      Prior Function            PT Goals (current goals can now be found in the care plan section) Acute Rehab PT Goals PT Goal Formulation: With patient Time For Goal Achievement: 06/20/17 Potential to Achieve Goals: Good Progress towards PT goals: Progressing toward goals    Frequency  7X/week      PT Plan Current plan remains appropriate    Co-evaluation              AM-PAC PT "6 Clicks" Daily Activity  Outcome Measure  Difficulty turning over in bed (including adjusting bedclothes, sheets and blankets)?: Total Difficulty moving from lying on back to sitting on the side of the bed? : Total Difficulty sitting down on and standing up from a chair  with arms (e.g., wheelchair, bedside commode, etc,.)?: Total Help needed moving to and from a bed to chair (including a wheelchair)?: A Little Help needed walking in hospital room?: A Little Help needed climbing 3-5 steps with a railing? : A Lot 6 Click Score: 11    End of Session Equipment Utilized During Treatment: Gait belt Activity Tolerance: Patient tolerated treatment well Patient left: in chair;with call bell/phone within reach;with chair alarm set;with family/visitor present Nurse Communication: Mobility status PT Visit Diagnosis: Other abnormalities of gait and mobility (R26.89);Pain;Other symptoms and signs involving the nervous system (R29.898) Pain - Right/Left: Left Pain - part of body: Hip     Time: 1610-9604 PT Time Calculation (min) (ACUTE ONLY): 15 min  Charges:  $Gait Training: 8-22 mins                    G Codes:       [place], [place], [place] [phone]    Alessandra Bevels Chang Tiggs 06/14/2017, 4:45 PM

## 2017-06-15 LAB — GLUCOSE, CAPILLARY
GLUCOSE-CAPILLARY: 119 mg/dL — AB (ref 65–99)
Glucose-Capillary: 165 mg/dL — ABNORMAL HIGH (ref 65–99)

## 2017-06-15 NOTE — Progress Notes (Signed)
Physical Therapy Treatment Patient Details Name: Jesse Escobar MRN: [id number] DOB: 1951/03/13 Today's Date: 06/15/2017    History of Present Illness Pt is 66 y/o male s/p elective L THA, direct anterior approach. PMH includes parkinson's disease, HTN, DM, CKD, anxiety/depression.     PT Comments    Pt agreeable to mobilize OOB with PT. Pt slow to initiate movement and requires physical assistance and VCs for all bed mobility. Pt tolerated seated exercises well and was able to navigate stairs today with wife present and able to demonstrate knowledge of assisting the pt with this activity. Pt became more alert and active as session went on and was able to ambulate a good distance around the unit with RW. At this time, pt will need a good amount of assistance at home with bed mobility for safe d/c. Discussed with wife the need for increased assistance at home for bed mobility. Will continue to follow for mobilization, strengthening, and increasing activity tolerance for safe d/c.    Follow Up Recommendations  Supervision/Assistance - 24 hour;DC plan and follow up therapy as arranged by surgeon     Equipment Recommendations  Rolling walker with 5" wheels;3in1 (PT);Hospital bed    Recommendations for Other Services       Precautions / Restrictions Precautions Precautions: Fall Restrictions LLE Weight Bearing: Weight bearing as tolerated    Mobility  Bed Mobility Overal bed mobility: Needs Assistance Bed Mobility: Supine to Sit     Supine to sit: HOB elevated;Mod assist     General bed mobility comments: assist to move legs off of bed, HOB 40 degrees, max sequential cues, increased time  Transfers Overall transfer level: Needs assistance   Transfers: Sit to/from Stand Sit to Stand: Min assist         General transfer comment: cues for sequence, increased time, assist to rise, cues for increased hip flexion and anterior weight shift with  sitting  Ambulation/Gait Ambulation/Gait assistance: Min guard Ambulation Distance (Feet): 250 Feet Assistive device: Rolling walker (2 wheeled) Gait Pattern/deviations: Step-through pattern;Decreased stride length;Trunk flexed   Gait velocity interpretation: Below normal speed for age/gender General Gait Details: cues for posture and safety   Stairs Stairs: Yes   Stair Management: Step to pattern;Sideways;One rail Left Number of Stairs: 4 General stair comments: cues for sequence and spacing feet to fit on steps  Wheelchair Mobility    Modified Rankin (Stroke Patients Only)       Balance Overall balance assessment: Needs assistance   Sitting balance-Leahy Scale: Good       Standing balance-Leahy Scale: Poor                              Cognition Arousal/Alertness: Awake/alert Behavior During Therapy: Flat affect Overall Cognitive Status: Within Functional Limits for tasks assessed                                        Exercises Total Joint Exercises Ankle Circles/Pumps: AROM;Left;Supine;10 reps Quad Sets: AROM;Left;Supine;10 reps Heel Slides: AAROM;Supine;Left;10 reps Hip ABduction/ADduction: AAROM;Left;Supine;5 reps    General Comments        Pertinent Vitals/Pain Pain Assessment: 0-10 Pain Score: 1  Pain Location: L hip Pain Descriptors / Indicators: Sore Pain Intervention(s): Limited activity within patient's tolerance;Ice applied;Monitored during session    Home Living  Prior Function            PT Goals (current goals can now be found in the care plan section) Progress towards PT goals: Progressing toward goals    Frequency    7X/week      PT Plan Current plan remains appropriate    Co-evaluation              AM-PAC PT "6 Clicks" Daily Activity  Outcome Measure  Difficulty turning over in bed (including adjusting bedclothes, sheets and blankets)?: Total    Difficulty sitting down on and standing up from a chair with arms (e.g., wheelchair, bedside commode, etc,.)?: Total Help needed moving to and from a bed to chair (including a wheelchair)?: A Little Help needed walking in hospital room?: A Little Help needed climbing 3-5 steps with a railing? : A Little 6 Click Score: 11    End of Session Equipment Utilized During Treatment: Gait belt Activity Tolerance: Patient tolerated treatment well Patient left: in chair;with chair alarm set;with call bell/phone within reach;with family/visitor present Nurse Communication: Mobility status PT Visit Diagnosis: Other abnormalities of gait and mobility (R26.89);Pain;Other symptoms and signs involving the nervous system (R29.898);Muscle weakness (generalized) (M62.81) Pain - Right/Left: Left Pain - part of body: Hip     Time: [id number] PT Time Calculation (min) (ACUTE ONLY): 33 min  Charges:  $Gait Training: 8-22 mins $Therapeutic Exercise: 8-22 mins                    G Codes:       Arneta ClicheVictoria Abhinav Mayorquin, SPT Acute Rehab [phone]    Arneta ClicheVictoria Hughes Wyndham 06/15/2017, 9:02 AM

## 2017-06-15 NOTE — Progress Notes (Signed)
Physical Therapy Treatment Patient Details Name: Jesse Escobar MRN: [id number] DOB: Apr 15, 1951 Today's Date: 06/15/2017    History of Present Illness Pt is 66 y/o male s/p elective L THA, direct anterior approach. PMH includes parkinson's disease, HTN, DM, CKD, anxiety/depression.     PT Comments    Pt more alert and mobile than this am and required less physical assistance with bed mobility. Pt continues to have limited mobility secondary to decreased activity tolerance and pain. During ambulation, pt was given max VCs for decreasing shuffling of gait (likely compensation for fatigue); however pt unable to correct. Will continue to follow for mobilization, strengthening and increased activity tolerance for safe d/c home.    Follow Up Recommendations  Supervision/Assistance - 24 hour;DC plan and follow up therapy as arranged by surgeon     Equipment Recommendations  Rolling walker with 5" wheels;3in1 (PT);Hospital bed    Recommendations for Other Services       Precautions / Restrictions Restrictions Weight Bearing Restrictions: Yes LLE Weight Bearing: Weight bearing as tolerated    Mobility  Bed Mobility Overal bed mobility: Needs Assistance Bed Mobility: Supine to Sit;Sit to Supine     Supine to sit: Min assist Sit to supine: Min assist   General bed mobility comments: assist to move LLE onto and off of bed, bed flat with increased time, no rails  Transfers Overall transfer level: Needs assistance   Transfers: Sit to/from Stand Sit to Stand: Min guard         General transfer comment: increased time  Ambulation/Gait Ambulation/Gait assistance: Min guard Ambulation Distance (Feet): 255 Feet Assistive device: Rolling walker (2 wheeled) Gait Pattern/deviations: Step-through pattern;Decreased stride length;Trunk flexed Gait velocity: Decreased   General Gait Details: Cues for posture and safety. Pt with increased shuffling of gait towards end of walk (likely  d/t fatigue) and pt unable to correct    Stairs            Wheelchair Mobility    Modified Rankin (Stroke Patients Only)       Balance Overall balance assessment: Needs assistance   Sitting balance-Leahy Scale: Good       Standing balance-Leahy Scale: Poor                              Cognition Arousal/Alertness: Awake/alert Behavior During Therapy: Flat affect Overall Cognitive Status: Within Functional Limits for tasks assessed                                        Exercises Total Joint Exercises Hip ABduction/ADduction: AROM;Left;Standing;10 reps Knee Flexion: AROM;Left;Standing;10 reps Marching in Standing: AROM;Left;Standing;10 reps Standing Hip Extension: AROM;Left;Standing;10 reps    General Comments        Pertinent Vitals/Pain Pain Assessment: No/denies pain    Home Living                      Prior Function            PT Goals (current goals can now be found in the care plan section) Progress towards PT goals: Progressing toward goals    Frequency    7X/week      PT Plan Current plan remains appropriate    Co-evaluation              AM-PAC PT "6 Clicks" Daily Activity  Outcome Measure  Difficulty turning over in bed (including adjusting bedclothes, sheets and blankets)?: Total Difficulty moving from lying on back to sitting on the side of the bed? : Total Difficulty sitting down on and standing up from a chair with arms (e.g., wheelchair, bedside commode, etc,.)?: A Little Help needed moving to and from a bed to chair (including a wheelchair)?: A Little   Help needed climbing 3-5 steps with a railing? : A Little 6 Click Score: 11    End of Session Equipment Utilized During Treatment: Gait belt Activity Tolerance: Patient tolerated treatment well Patient left: in bed;with call bell/phone within reach;with family/visitor present Nurse Communication: Mobility status PT Visit  Diagnosis: Other abnormalities of gait and mobility (R26.89);Muscle weakness (generalized) (M62.81);Difficulty in walking, not elsewhere classified (R26.2)     Time: [id number] PT Time Calculation (min) (ACUTE ONLY): 21 min  Charges:  $Gait Training: 8-22 mins                    G Codes:       Arneta ClicheVictoria Nasiyah Laverdiere, SPT Acute Rehab [phone]    Arneta ClicheVictoria Dotty Gonzalo 06/15/2017, 1:04 PM

## 2017-06-15 NOTE — Progress Notes (Signed)
   Assessment: 2 Days Post-Op  S/P Procedure(s) (LRB): TOTAL HIP ARTHROPLASTY ANTERIOR APPROACH (Left) by Dr. Jewel Baizeimothy D. Eulah Escobar on 06/13/17  Principal Problem:   Primary osteoarthritis of left hip Active Problems:   Parkinson's disease (HCC)   Hyperlipidemia   CKD (chronic kidney disease) stage 2, GFR 60-89 ml/min   Type 2 diabetes mellitus (HCC)  Doing well POD2.  Mobilizing w/ PT, pain controlled, tolerating diet, and voiding.  Fit for discharge when hospital bed has been delivered to home.  Plan: Advance diet Up with therapy Encourage PO fluids Incentive Spirometry - up to 1750 this morning. Apply ice  Weight Bearing: Weight Bearing as Tolerated (WBAT)  Dressings: Maintain Mepilex.  VTE prophylaxis: Aspirin, SCDs, ambulation  Dispo: Home w/ HHPT.  He is fit for discharge to home pending hospital bed delivery at his home. They have 15 stairs up to bedrooms.   Subjective: Patient reports pain as mild. Pain controlled with PO meds.  Tolerating diet.  Urinating.  +Flatus.  No CP, SOB.  OOB in room with PT.  Objective:   VITALS:   Vitals:   06/14/17 0438 06/14/17 1500 06/14/17 2215 06/15/17 0616  BP: (!) 142/79 (!) 149/83 (!) 145/78 138/71  Pulse: 75 90 91 72  Resp: 18 20 16 16   Temp: 97.8 F (36.6 C) 99.2 F (37.3 C) 98.7 F (37.1 C) 97.6 F (36.4 C)  TempSrc: Oral Oral Oral Oral  SpO2: 97% 96% 100% 98%  Weight:      Height:       CBC Latest Ref Rng & Units 06/02/2017 07/08/2013 12/12/2008  WBC 4.0 - 10.5 K/uL 5.4 10.7(H) 5.0  Hemoglobin 13.0 - 17.0 g/dL 16.114.9 09.614.8 04.514.9  Hematocrit 39.0 - 52.0 % 45.1 43.4 44.0  Platelets 150 - 400 K/uL 209 195 190   BMP Latest Ref Rng & Units 06/02/2017 07/08/2013 12/12/2008  Glucose 65 - 99 mg/dL 409(W100(H) 119(J192(H) 96  BUN 6 - 20 mg/dL 13 14 13   Creatinine 0.61 - 1.24 mg/dL 4.781.18 2.95(A1.59(H) 1.1  Sodium 135 - 145 mmol/L 138 141 142  Potassium 3.5 - 5.1 mmol/L 4.2 4.2 4.1  Chloride 101 - 111 mmol/L 104 104 106  CO2 22 - 32 mmol/L 24 24 29    Calcium 8.9 - 10.3 mg/dL 9.7 9.7 9.1   Intake/Output      07/18 0701 - 07/19 0700   P.O. 480   I.V. (mL/kg) 278.2 (2.5)   Total Intake(mL/kg) 758.2 (6.7)   Urine (mL/kg/hr) 350 (0.1)   Total Output 350   Net +408.2         Physical Exam: General: NAD.  Supine in bed on arrival. Pleasant, interactive. Resp: No increased wob Cardio: regular rate and rhythm ABD soft Neurologically intact MSK Neurovascularly intact Sensation intact distally Dorsiflexion/Plantar flexion, EHL, FHL intact at baseline. Incision: dressing C/D/I   Albina BilletHenry Calvin Martensen III, PA-C 06/15/2017, 6:50 AM

## 2017-06-15 NOTE — Progress Notes (Signed)
Pt discharge instructions reviewed with pt and wife. Pt and wife verbalize understanding. Pt belongings with pt. Pt discharged via wheelchair. Pt is not in distress.

## 2017-06-20 DIAGNOSIS — F0390 Unspecified dementia without behavioral disturbance: Secondary | ICD-10-CM | POA: Diagnosis not present

## 2017-06-20 DIAGNOSIS — G20 Parkinson's disease: Secondary | ICD-10-CM | POA: Diagnosis not present

## 2017-06-20 DIAGNOSIS — M549 Dorsalgia, unspecified: Secondary | ICD-10-CM | POA: Diagnosis not present

## 2017-06-20 DIAGNOSIS — M1612 Unilateral primary osteoarthritis, left hip: Secondary | ICD-10-CM | POA: Diagnosis not present

## 2017-06-20 DIAGNOSIS — M25552 Pain in left hip: Secondary | ICD-10-CM | POA: Diagnosis not present

## 2017-06-21 DIAGNOSIS — M545 Low back pain: Secondary | ICD-10-CM | POA: Diagnosis not present

## 2017-06-21 DIAGNOSIS — I129 Hypertensive chronic kidney disease with stage 1 through stage 4 chronic kidney disease, or unspecified chronic kidney disease: Secondary | ICD-10-CM | POA: Diagnosis not present

## 2017-06-21 DIAGNOSIS — Z7984 Long term (current) use of oral hypoglycemic drugs: Secondary | ICD-10-CM | POA: Diagnosis not present

## 2017-06-21 DIAGNOSIS — F339 Major depressive disorder, recurrent, unspecified: Secondary | ICD-10-CM | POA: Diagnosis not present

## 2017-06-21 DIAGNOSIS — E785 Hyperlipidemia, unspecified: Secondary | ICD-10-CM | POA: Diagnosis not present

## 2017-06-21 DIAGNOSIS — Z471 Aftercare following joint replacement surgery: Secondary | ICD-10-CM | POA: Diagnosis not present

## 2017-06-21 DIAGNOSIS — Z9181 History of falling: Secondary | ICD-10-CM | POA: Diagnosis not present

## 2017-06-21 DIAGNOSIS — F419 Anxiety disorder, unspecified: Secondary | ICD-10-CM | POA: Diagnosis not present

## 2017-06-21 DIAGNOSIS — G4752 REM sleep behavior disorder: Secondary | ICD-10-CM | POA: Diagnosis not present

## 2017-06-21 DIAGNOSIS — G20 Parkinson's disease: Secondary | ICD-10-CM | POA: Diagnosis not present

## 2017-06-21 DIAGNOSIS — E1122 Type 2 diabetes mellitus with diabetic chronic kidney disease: Secondary | ICD-10-CM | POA: Diagnosis not present

## 2017-06-21 DIAGNOSIS — N182 Chronic kidney disease, stage 2 (mild): Secondary | ICD-10-CM | POA: Diagnosis not present

## 2017-06-21 DIAGNOSIS — Z6837 Body mass index (BMI) 37.0-37.9, adult: Secondary | ICD-10-CM | POA: Diagnosis not present

## 2017-06-21 DIAGNOSIS — E669 Obesity, unspecified: Secondary | ICD-10-CM | POA: Diagnosis not present

## 2017-06-21 DIAGNOSIS — Z7982 Long term (current) use of aspirin: Secondary | ICD-10-CM | POA: Diagnosis not present

## 2017-06-21 DIAGNOSIS — G8929 Other chronic pain: Secondary | ICD-10-CM | POA: Diagnosis not present

## 2017-06-21 DIAGNOSIS — Z96642 Presence of left artificial hip joint: Secondary | ICD-10-CM | POA: Diagnosis not present

## 2017-06-28 DIAGNOSIS — M545 Low back pain: Secondary | ICD-10-CM | POA: Diagnosis not present

## 2017-06-28 DIAGNOSIS — E1122 Type 2 diabetes mellitus with diabetic chronic kidney disease: Secondary | ICD-10-CM | POA: Diagnosis not present

## 2017-06-28 DIAGNOSIS — Z96642 Presence of left artificial hip joint: Secondary | ICD-10-CM | POA: Diagnosis not present

## 2017-06-28 DIAGNOSIS — G8929 Other chronic pain: Secondary | ICD-10-CM | POA: Diagnosis not present

## 2017-06-28 DIAGNOSIS — Z6837 Body mass index (BMI) 37.0-37.9, adult: Secondary | ICD-10-CM | POA: Diagnosis not present

## 2017-06-28 DIAGNOSIS — Z9181 History of falling: Secondary | ICD-10-CM | POA: Diagnosis not present

## 2017-06-28 DIAGNOSIS — M1612 Unilateral primary osteoarthritis, left hip: Secondary | ICD-10-CM | POA: Diagnosis not present

## 2017-06-28 DIAGNOSIS — F419 Anxiety disorder, unspecified: Secondary | ICD-10-CM | POA: Diagnosis not present

## 2017-06-28 DIAGNOSIS — N182 Chronic kidney disease, stage 2 (mild): Secondary | ICD-10-CM | POA: Diagnosis not present

## 2017-06-28 DIAGNOSIS — Z471 Aftercare following joint replacement surgery: Secondary | ICD-10-CM | POA: Diagnosis not present

## 2017-06-28 DIAGNOSIS — G20 Parkinson's disease: Secondary | ICD-10-CM | POA: Diagnosis not present

## 2017-06-28 DIAGNOSIS — F339 Major depressive disorder, recurrent, unspecified: Secondary | ICD-10-CM | POA: Diagnosis not present

## 2017-06-28 DIAGNOSIS — I129 Hypertensive chronic kidney disease with stage 1 through stage 4 chronic kidney disease, or unspecified chronic kidney disease: Secondary | ICD-10-CM | POA: Diagnosis not present

## 2017-06-28 DIAGNOSIS — Z7984 Long term (current) use of oral hypoglycemic drugs: Secondary | ICD-10-CM | POA: Diagnosis not present

## 2017-06-28 DIAGNOSIS — E669 Obesity, unspecified: Secondary | ICD-10-CM | POA: Diagnosis not present

## 2017-06-28 DIAGNOSIS — Z7982 Long term (current) use of aspirin: Secondary | ICD-10-CM | POA: Diagnosis not present

## 2017-06-28 DIAGNOSIS — E785 Hyperlipidemia, unspecified: Secondary | ICD-10-CM | POA: Diagnosis not present

## 2017-06-28 DIAGNOSIS — G4752 REM sleep behavior disorder: Secondary | ICD-10-CM | POA: Diagnosis not present

## 2017-07-03 DIAGNOSIS — G8929 Other chronic pain: Secondary | ICD-10-CM | POA: Diagnosis not present

## 2017-07-03 DIAGNOSIS — Z7982 Long term (current) use of aspirin: Secondary | ICD-10-CM | POA: Diagnosis not present

## 2017-07-03 DIAGNOSIS — F419 Anxiety disorder, unspecified: Secondary | ICD-10-CM | POA: Diagnosis not present

## 2017-07-03 DIAGNOSIS — N182 Chronic kidney disease, stage 2 (mild): Secondary | ICD-10-CM | POA: Diagnosis not present

## 2017-07-03 DIAGNOSIS — E1122 Type 2 diabetes mellitus with diabetic chronic kidney disease: Secondary | ICD-10-CM | POA: Diagnosis not present

## 2017-07-03 DIAGNOSIS — E669 Obesity, unspecified: Secondary | ICD-10-CM | POA: Diagnosis not present

## 2017-07-03 DIAGNOSIS — Z9181 History of falling: Secondary | ICD-10-CM | POA: Diagnosis not present

## 2017-07-03 DIAGNOSIS — E785 Hyperlipidemia, unspecified: Secondary | ICD-10-CM | POA: Diagnosis not present

## 2017-07-03 DIAGNOSIS — F339 Major depressive disorder, recurrent, unspecified: Secondary | ICD-10-CM | POA: Diagnosis not present

## 2017-07-03 DIAGNOSIS — M545 Low back pain: Secondary | ICD-10-CM | POA: Diagnosis not present

## 2017-07-03 DIAGNOSIS — I129 Hypertensive chronic kidney disease with stage 1 through stage 4 chronic kidney disease, or unspecified chronic kidney disease: Secondary | ICD-10-CM | POA: Diagnosis not present

## 2017-07-03 DIAGNOSIS — G4752 REM sleep behavior disorder: Secondary | ICD-10-CM | POA: Diagnosis not present

## 2017-07-03 DIAGNOSIS — Z471 Aftercare following joint replacement surgery: Secondary | ICD-10-CM | POA: Diagnosis not present

## 2017-07-03 DIAGNOSIS — Z6837 Body mass index (BMI) 37.0-37.9, adult: Secondary | ICD-10-CM | POA: Diagnosis not present

## 2017-07-03 DIAGNOSIS — Z7984 Long term (current) use of oral hypoglycemic drugs: Secondary | ICD-10-CM | POA: Diagnosis not present

## 2017-07-03 DIAGNOSIS — G20 Parkinson's disease: Secondary | ICD-10-CM | POA: Diagnosis not present

## 2017-07-03 DIAGNOSIS — Z96642 Presence of left artificial hip joint: Secondary | ICD-10-CM | POA: Diagnosis not present

## 2017-07-28 DIAGNOSIS — M1612 Unilateral primary osteoarthritis, left hip: Secondary | ICD-10-CM | POA: Diagnosis not present

## 2017-08-02 ENCOUNTER — Ambulatory Visit (INDEPENDENT_AMBULATORY_CARE_PROVIDER_SITE_OTHER): Payer: PPO | Admitting: Neurology

## 2017-08-02 ENCOUNTER — Encounter: Payer: Self-pay | Admitting: Neurology

## 2017-08-02 VITALS — BP 153/91 | HR 84 | Ht 69.0 in | Wt 252.0 lb

## 2017-08-02 DIAGNOSIS — R413 Other amnesia: Secondary | ICD-10-CM | POA: Diagnosis not present

## 2017-08-02 DIAGNOSIS — R269 Unspecified abnormalities of gait and mobility: Secondary | ICD-10-CM | POA: Diagnosis not present

## 2017-08-02 DIAGNOSIS — G20 Parkinson's disease: Secondary | ICD-10-CM | POA: Diagnosis not present

## 2017-08-02 MED ORDER — CARBIDOPA-LEVODOPA ER 50-200 MG PO TBCR: 1.0000 | EXTENDED_RELEASE_TABLET | Freq: Three times a day (TID) | ORAL | 5 refills | Status: AC

## 2017-08-02 NOTE — Progress Notes (Signed)
Reason for visit: Parkinson's disease  Jesse Escobar is an 66 y.o. male  History of present illness:  Jesse Escobar is a 66 year old right-handed black male with a history of Parkinson's disease. The patient also reports some mild memory changes. He had not been compliant with medications and he had not been exercising much, but on 06/13/2017 he had a left total hip replacement secondary to degenerative arthritis. He has been able to walk better without as much discomfort. The patient has been better about getting his medications in regularly, he takes 1.5 of the 25/100 mg CR Sinemet tablets 3 times daily. He does have some slight dizziness after he takes the dose, but he indicates that the medication tends to wear off before the next dose. The does have some gait instability, he has not had any recent falls. He returns to the office today for an evaluation. He denies any problems with choking with swallowing.  Past Medical History:  Diagnosis Date  . Anxiety and depression   . Arthritis   . Chronic low back pain   . Gait disorder 03/01/2016  . History of kidney stones   . Hyperlipidemia   . Hypertension   . Kidney stones   . Left ureteral calculus   . 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 Memory difficulties 07/17/2014  . Obesity   . Parkinson's disease (HCC)   . REM sleep behavior disorder   . Sleep behavior disorder, REM 03/01/2016  . Type 2 diabetes mellitus (HCC)     Past Surgical History:  Procedure Laterality Date  . lithrotripsy     laser  . TOTAL HIP ARTHROPLASTY Left 06/13/2017  . TOTAL HIP ARTHROPLASTY Left 06/13/2017   Procedure: TOTAL HIP ARTHROPLASTY ANTERIOR APPROACH;  Surgeon: Sheral ApleyMurphy, Timothy D, MD;  Location: [facility] OR;  Service: Orthopedics;  Laterality: Left;    Family History  Problem Relation Age of Onset  . Stroke Father   . Hypertension Sister   . Hypertension Brother   . Hypertension Sister   . Hypertension Sister     . Hypertension Brother   . Parkinson's disease Brother   . Hypertension Brother   . Hypertension Brother   . Hypertension Brother   . Diabetes Unknown     Social history:  reports that he has never smoked. He has never used smokeless tobacco. He reports that he does not drink alcohol or use drugs.    Allergies  Allergen Reactions  . No Known Allergies     Medications:  Prior to Admission medications   Medication Sig Start Date End Date Taking? Authorizing Provider  Carbidopa-Levodopa ER (SINEMET CR) 25-100 MG tablet controlled release Take 1 tablet by mouth 3 (three) times daily. Patient taking differently: Take 1.5 tablets by mouth 3 (three) times daily.  02/10/17  Yes York SpanielWillis, Mazey Mantell K, MD  ezetimibe (ZETIA) 10 MG tablet Take 10 mg by mouth daily. 05/05/17  Yes [provider]  losartan (COZAAR) 50 MG tablet Take 50 mg by mouth daily.   Yes [provider]  metFORMIN (GLUCOPHAGE) 500 MG tablet Take 1,000 mg by mouth at bedtime.  05/21/14  Yes [provider]  Nutritional Supplements (JUICE PLUS FIBRE PO) Take 4 tablets by mouth daily. 2 TABLETS OF ORCHARD BLEND & 2 TABLETS OF GARDEN BLEND   Yes [provider]  ONE TOUCH ULTRA TEST test strip 1 each by Other route daily.  02/17/14  Yes [provider]  selegiline (ELDEPRYL) 5 MG capsule TAKE 1 CAPSULE (5 MG TOTAL) BY MOUTH 2 (TWO) TIMES DAILY. ONE IN THE MORNING AND ONE AT NOON 08/08/16  Yes York Spaniel, MD  vitamin E 400 UNIT capsule Take 1,200 Units by mouth 3 (three) times a week.   Yes [provider]    ROS:  Out of a complete 14 system review of symptoms, the patient complains only of the following symptoms, and all other reviewed systems are negative.  Fatigue Facial swelling, hearing loss, ringing in the ears, drooling Eye itching, eye redness, light sensitivity, double vision, loss of vision, blurred vision Leg swelling Cold intolerance Constipation Daytime  sleepiness, snoring, sleep talking, acting out dreams Back pain, walking difficulty Itching Memory loss, weakness, tremors, facial drooping Confusion, decreased concentration, depression, anxiety, hallucinations   Blood pressure (!) 153/91, pulse 84, height 5\' 9"  (1.753 m), weight 252 lb (114.3 kg).  Physical Exam  General: The patient is alert and cooperative at the time of the examination.  Skin: No significant peripheral edema is noted.   Neurologic Exam  Mental status: The patient is alert and oriented x 3 at the time of the examination. The Mini-Mental Status Examination done today shows a total score of 27/30.   Cranial nerves: Facial symmetry is present. Speech is normal, no aphasia or dysarthria is noted. Extraocular movements are full. Visual fields are full. Prominent masking of the face is seen, decreased eye blink is noted.  Motor: The patient has good strength in all 4 extremities.  Sensory examination: Soft touch sensation is symmetric on the face, arms, and legs.  Coordination: The patient has good finger-nose-finger and heel-to-shin bilaterally.  Gait and station: The patient is able to arise from a seated position with arms crossed with some difficulty. Once up, he is able to ambulate independently, he oftentimes will stumble as he catches a toe with walking. Decreased arm swing is noted. Tandem gait is unsteady. Romberg is negative.  Reflexes: Deep tendon reflexes are symmetric.   Assessment/Plan:  1. Parkinson's disease   2. Gait disturbance   3. Mild memory disturbance   The patient has had a left total hip replacement, he does have some baseline gait instability with the Parkinson's disease. The patient will be sent for physical therapy for gait training. The Sinemet will be increased taking the 50/200 mg CR tablets taking 1 tablet 3 times daily. He will follow-up in 5 months, he is encouraged to remain active.    Marlan Palau MD 08/02/2017 10:36  AM  [facility] [address] [place], [place] [address]  Phone [phone] Fax [phone]

## 2017-08-02 NOTE — Patient Instructions (Signed)
   We will go up on the Sinemet to the 50/200 mg CR dose taking one three times a day.  Sinemet (carbidopa) may result in confusion or hallucinations, drowsiness, nausea, or dizziness. If any significant side effects are noted, please contact our office. Sinemet may not be well absorbed when taken with high protein meals, if tolerated it is best to take 30-45 minutes before you eat.

## 2017-08-14 ENCOUNTER — Ambulatory Visit: Payer: PPO | Admitting: Physical Therapy

## 2017-08-16 ENCOUNTER — Ambulatory Visit: Payer: PPO | Attending: Neurology | Admitting: Physical Therapy

## 2017-08-16 DIAGNOSIS — R2681 Unsteadiness on feet: Secondary | ICD-10-CM | POA: Diagnosis not present

## 2017-08-16 DIAGNOSIS — R2689 Other abnormalities of gait and mobility: Secondary | ICD-10-CM | POA: Diagnosis not present

## 2017-08-16 DIAGNOSIS — M6281 Muscle weakness (generalized): Secondary | ICD-10-CM | POA: Insufficient documentation

## 2017-08-16 DIAGNOSIS — R293 Abnormal posture: Secondary | ICD-10-CM | POA: Diagnosis not present

## 2017-08-16 NOTE — Patient Instructions (Addendum)
Parkinson Foundation:  www.[email or web address]    [employer]:  [email or web address]  Foundation: [email or web address]

## 2017-08-16 NOTE — Addendum Note (Signed)
Addended by: Gean Maidens on: 08/16/2017 10:31 PM   Modules accepted: Orders

## 2017-08-16 NOTE — Therapy (Signed)
[facility] [address] [place], [place], [address] Phone: [phone]   Fax:  [phone]  Physical Therapy Treatment  Patient Details  Name: Jesse Escobar MRN: [id number] Date of Birth: [birth year] Referring Provider: Hennie Duos. Anne Hahn, MD  Encounter Date: 08/16/2017      PT End of Session - 08/16/17 2208    Visit Number 1   Number of Visits 10   Date for PT Re-Evaluation 10/15/17   Authorization Type [employer] every 10th visit   PT Start Time 1019   PT Stop Time 1101   PT Time Calculation (min) 42 min   Activity Tolerance Patient tolerated treatment well   Behavior During Therapy [facility] for tasks assessed/performed      Past Medical History:  Diagnosis Date  . Anxiety and depression   . Arthritis   . Chronic low back pain   . Gait disorder 03/01/2016  . History of kidney stones   . Hyperlipidemia   . Hypertension   . Kidney stones   . Left ureteral calculus   . 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 Memory difficulties 07/17/2014  . Obesity   . Parkinson's disease (HCC)   . REM sleep behavior disorder   . Sleep behavior disorder, REM 03/01/2016  . Type 2 diabetes mellitus (HCC)     Past Surgical History:  Procedure Laterality Date  . lithrotripsy     laser  . TOTAL HIP ARTHROPLASTY Left 06/13/2017  . TOTAL HIP ARTHROPLASTY Left 06/13/2017   Procedure: TOTAL HIP ARTHROPLASTY ANTERIOR APPROACH;  Surgeon: Sheral Apley, MD;  Location: [facility] OR;  Service: Orthopedics;  Laterality: Left;    There were no vitals filed for this visit.      Subjective Assessment - 08/16/17 1025    Subjective Pt feels like he may need a little more help in the Parkinson's area.  Hip is better from THR (L 05/2017), and now he wants to work on walking, getting up better from chair, and balance.  Wife would like strategies/tips in taking walks.  Patient is not using  assistive device, but sometimes he reports feeling "swimmy-headed" with trying to start walking.   Patient is accompained by: Family member  wife   Pertinent History arthritis, s/p L THR 06/13/2017 with anterior approach, anxiety, depression, HTN, obesity, mild memory changes, DM   Patient Stated Goals improve being able to get up from chairs easier, better balance for longer distances   Currently in Pain? No/denies            [facility] PT Assessment - 08/16/17 1030      Assessment   Medical Diagnosis Parkinson's disease s/p L THR (06/13/17)   Referring Provider Hennie Duos. Anne Hahn, MD   Onset Date/Surgical Date 08/02/17  MD visit; 06/13/17 L THR)     Precautions   Precautions Fall  WBAT     Balance Screen   Has the patient fallen in the past 6 months No   Has the patient had a decrease in activity level because of a fear of falling?  Yes   Is the patient reluctant to leave their home because of a fear of falling?  No     Home Nurse, mental health Private residence   Living Arrangements Spouse/significant other   Available Help at Discharge Family   Type of Home House   Home Access Stairs to enter   Entrance Stairs-Number of Steps  3   Entrance Stairs-Rails Right;Left;Can reach both   Home Layout Two level;Bed/bath upstairs   Alternate Level Stairs-Number of Steps 16   Alternate Level Stairs-Rails Right   Home Equipment Walker - 2 wheels;Walker - 4 wheels     Prior Function   Level of Independence Needs assistance with ADLs;Independent with household mobility without device;Independent with community mobility with device   Leisure Enjoyed walking around the neigborhood; enjoys fishing and hunting     Observation/Other Assessments   Focus on Therapeutic Outcomes (FOTO)  NA     Posture/Postural Control   Posture/Postural Control Postural limitations   Postural Limitations Rounded Shoulders;Forward head     Tone   Assessment Location Right Lower Extremity;Left  Lower Extremity     ROM / Strength   AROM / PROM / Strength Strength;AROM     AROM   Overall AROM Comments appears to have hamstring tightness bilateral lower extremities with seated LAQ position     Strength   Overall Strength Deficits   Strength Assessment Site Hip;Knee;Ankle   Right/Left Hip Right;Left   Right Hip Flexion 4+/5   Left Hip Flexion 4/5   Right/Left Knee Right;Left   Right Knee Flexion 4/5   Right Knee Extension 4/5   Left Knee Flexion 3+/5   Left Knee Extension 4/5   Right/Left Ankle Right;Left   Right Ankle Dorsiflexion 4/5   Left Ankle Dorsiflexion 4/5     Transfers   Transfers Sit to Stand;Stand to Sit   Sit to Stand 6: Modified independent (Device/Increase time);Without upper extremity assist;From chair/3-in-1   Five time sit to stand comments  23.22   Stand to Sit 6: Modified independent (Device/Increase time);Without upper extremity assist;To chair/3-in-1     Ambulation/Gait   Ambulation/Gait Yes   Ambulation/Gait Assistance 5: Supervision   Ambulation Distance (Feet) 200 Feet   Assistive device None   Gait Pattern Step-through pattern;Decreased arm swing - right;Decreased arm swing - left;Decreased step length - left;Decreased dorsiflexion - left;Decreased trunk rotation;Narrow base of support;Poor foot clearance - left   Ambulation Surface Level;Indoor   Gait velocity 11.78 sec = 2.78 ft/sec     Standardized Balance Assessment   Standardized Balance Assessment Dynamic Gait Index;Timed Up and Go Test     Dynamic Gait Index   Level Surface Mild Impairment   Change in Gait Speed Mild Impairment   Gait with Horizontal Head Turns Moderate Impairment   Gait with Vertical Head Turns Moderate Impairment   Gait and Pivot Turn Mild Impairment   Step Over Obstacle Moderate Impairment   Step Around Obstacles Mild Impairment   Steps Mild Impairment   Total Score 13   DGI comment: Scores <19/24 indicate increased fall risk     Timed Up and Go Test    Normal TUG (seconds) 17.94   Cognitive TUG (seconds) 18.47   TUG Comments Scores >13.5-15 seconds indicate increased fall risk.     High Level Balance   High Level Balance Comments Posterior push and release;  would fall if unaided     LLE Tone   LLE Tone Mild                             PT Education - 08/16/17 2205    Education provided Yes   Education Details When asked about leisure activities he wants to get back to, he replies hunting, fishing, and sex; pt has questions regarding sex life and PD-provided website information  for [employer], [employer] and [employer] for resources   Person(s) Educated Patient;Spouse   Methods Explanation;Handout   Comprehension Verbalized understanding             PT Long Term Goals - 08/16/17 2220      PT LONG TERM GOAL #1   Title Pt will perform HEP with wife's supervision to address Parkinson's specific deficits.   Time 5   Period Weeks   Status New   Target Date 09/15/17     PT LONG TERM GOAL #2   Title Pt will improve 5x sit<>stand to less than or equal to 16 seconds for improved efficiency and ease of transfers.   Time 5   Period Weeks   Status New   Target Date 09/15/17     PT LONG TERM GOAL #3   Title Pt will improve DGI score to at least 17/24 for decreased fall risk.   Time 5   Period Weeks   Status New   Target Date 09/15/17     PT LONG TERM GOAL #4   Title Pt will improve TUG score to less than or equal to 13.5 seconds for decreased fall risk.   Time 5   Period Weeks   Status New   Target Date 09/15/17     PT LONG TERM GOAL #5   Title Pt will ambulate at least 1000 ft, indoor and outdoor surfaces, modified independently, for improved gait efficiency and safety.   Time 5   Period Weeks   Status New   Target Date 09/15/17     Additional Long Term Goals   Additional Long Term Goals Yes     PT LONG TERM GOAL #6   Title Pt will verbalize  plans for continued community fitness upon D/C from PT.   Time 5   Period Weeks   Status New   Target Date 09/15/17               Plan - 08/16/17 2210    Clinical Impression Statement Pt is a 66 year old male who presents to OP PT with history of Parkinson's disease s/p L THR 06/13/17.  Pt presents with decreased strength, decreased flexibility, abnormal tone, abnormal posture, decreased balance, bradykinesia, decreased timing and coordination with gait.  Pt has decreased activity level and increased fear of falling.  Pt will benefit from skilled PT to address the above stated deficits for decreased fall risk and improved functional mobility.     History and Personal Factors relevant to plan of care: >3 co-morbidities, >3 systems involved, fall risk per TUG and DGI, THR 06/13/17   Clinical Presentation Evolving   Clinical Presentation due to: history of PD, fall in December 2017   Clinical Decision Making Moderate   Rehab Potential Good   Clinical Impairments Affecting Rehab Potential mild memory deficits-wife supportive, at eval   PT Frequency 2x / week  1x/wk for 1 week, then    PT Duration Other (comment)  2x/wk for 4 weeks; total POC = 5 weeks   PT Treatment/Interventions ADLs/Self Care Home Management;Gait training;DME Instruction;Neuromuscular re-education;Balance training;Therapeutic exercise;Therapeutic activities;Functional mobility training;Patient/family education   PT Next Visit Plan Initiate HEP to address Parkinson's deficits-PWR! Moves?, gait training with foot clearance and arm swing, sit<>stand practice; hamstring stretches   Consulted and Agree with Plan of Care Patient;Family member/caregiver   Family Member Consulted wife      Patient will benefit from skilled therapeutic intervention in order  to improve the following deficits and impairments:  Abnormal gait, Decreased balance, Decreased mobility, Difficulty walking, Decreased strength, Postural dysfunction,  Impaired flexibility, Impaired tone  Visit Diagnosis: Other abnormalities of gait and mobility  Unsteadiness on feet  Muscle weakness (generalized)  Abnormal posture       G-Codes - 09/08/17 04-22-25    Functional Assessment Tool Used (Outpatient Only) 5x:  23.22, TUG 17.94 sec, TUG cog 18.47 sec, DGI 13/24   Functional Limitation Mobility: Walking and moving around   Mobility: Walking and Moving Around Current Status [phone]) At least 40 percent but less than 60 percent impaired, limited or restricted   Mobility: Walking and Moving Around Goal Status [phone]) At least 20 percent but less than 40 percent impaired, limited or restricted      Problem List Patient Active Problem List   Diagnosis Date Noted  . Primary osteoarthritis of left hip 06/13/2017  . Parkinson's disease (HCC) 05/22/2017  . Hyperlipidemia 05/22/2017  . CKD (chronic kidney disease) stage 2, GFR 60-89 ml/min 05/22/2017  . Type 2 diabetes mellitus (HCC) 05/22/2017  . Sleep behavior disorder, REM 03/01/2016  . Gait disorder 03/01/2016  . Memory difficulties 07/17/2014  . Paralysis agitans (HCC) 04/15/2014  . WHEEZING 03/04/2009  . DEPRESSION 12/12/2008  . LOW BACK PAIN 12/12/2008  . NEPHROLITHIASIS, HX OF 12/12/2008    Leray Garverick W. Sep 08, 2017, 10:28 PM  Gean Maidens., PT   [facility] [address] [place], [place], [address] Phone: [phone]   Fax:  [phone]  Name: JAXX HUISH MRN: [id number] Date of Birth: 02-13-1951

## 2017-08-21 ENCOUNTER — Telehealth: Payer: Self-pay | Admitting: Neurology

## 2017-08-21 NOTE — Telephone Encounter (Signed)
I called and left a message. This apparently was very sudden and unexpected.  Not clear what the cause of death was.

## 2017-08-21 NOTE — Telephone Encounter (Signed)
The wife of pt called to inform that pt passed on 04-18-2023, the wife has not asked for a call back.  The Feb. Appointment has been cancelled

## 2017-08-22 ENCOUNTER — Ambulatory Visit: Payer: PPO | Admitting: Physical Therapy

## 2017-08-23 ENCOUNTER — Ambulatory Visit: Payer: PPO | Admitting: Physical Therapy

## 2017-08-25 ENCOUNTER — Encounter: Payer: Self-pay | Admitting: Physical Therapy

## 2017-08-25 NOTE — Therapy (Signed)
[facility] [address] [place], [place], [address] Phone: [phone]   Fax:  [phone]  Patient Details  Name: Jesse Escobar MRN: [id number] Date of Birth: 11-27-1951 Referring Provider:  No ref. provider found  Encounter Date: 09/15/17  PHYSICAL THERAPY DISCHARGE SUMMARY  Visits from Start of Care: 1  Current functional level related to goals / functional outcomes: eval notes-pt deceased (notified on 11-Sep-2017)   Remaining deficits: NA   Education / Equipment: NA  Plan: Patient agrees to discharge.  Patient goals were not met. Patient is being discharged due to                                                     ?????PT notified on 09/11/17 that pt is deceased.            G-Codes - 09/15/2017 1428    Functional Assessment Tool Used (Outpatient Only) 5x:  23.22, TUG 17.94 sec, TUG cog 18.47 sec, DGI 13/24 (eval only-pt is deceased)   Functional Limitation Mobility: Walking and moving around   Mobility: Walking and Moving Around Goal Status [phone]) At least 20 percent but less than 40 percent impaired, limited or restricted   Mobility: Walking and Moving Around Discharge Status [phone]) At least 40 percent but less than 60 percent impaired, limited or restricted      [place] Giammona W. 2017-09-15, 2:28 PM  Frazier Butt., PT   [facility] [address] [place], [place], [address] Phone: [phone]   Fax:  [phone]

## 2017-08-28 DIAGNOSIS — 419620001 Death: Secondary | SNOMED CT | POA: Diagnosis not present

## 2017-08-28 NOTE — Telephone Encounter (Signed)
Pt's wife called, she would like a call back from Dr Anne Hahn to discuss a few things to help bring come closure.

## 2017-08-28 NOTE — Telephone Encounter (Signed)
I called patient and talked with the wife.  The patient had complained of chest pain and feeling lightheaded and near syncopal prior to the onset of his death. The patient may have had a cardiac event or a pulmonary embolism as a source of his death.

## 2017-08-28 DEATH — deceased

## 2017-08-29 ENCOUNTER — Ambulatory Visit: Payer: PPO | Admitting: Physical Therapy

## 2017-08-31 ENCOUNTER — Ambulatory Visit: Payer: PPO | Admitting: Physical Therapy

## 2017-09-05 ENCOUNTER — Ambulatory Visit: Payer: PPO | Admitting: Physical Therapy

## 2017-09-06 ENCOUNTER — Ambulatory Visit: Payer: PPO | Admitting: Physical Therapy

## 2017-09-11 ENCOUNTER — Ambulatory Visit: Payer: PPO | Admitting: Physical Therapy

## 2017-09-13 ENCOUNTER — Ambulatory Visit: Payer: PPO | Admitting: Physical Therapy

## 2018-01-18 ENCOUNTER — Ambulatory Visit: Payer: PPO | Admitting: Neurology

## 2018-12-14 IMAGING — DX DG HIP (WITH OR WITHOUT PELVIS) 2-3V*L*
2 series · 2 of 2 positions shown · non-contrast
Comparison: None.

CLINICAL DATA: Left hip pain status post fall.

EXAM:
DG HIP (WITH OR WITHOUT PELVIS) 2-3V LEFT

[dg hip unilat w or w/o pelvis 2-3 views  (1 of 2)]
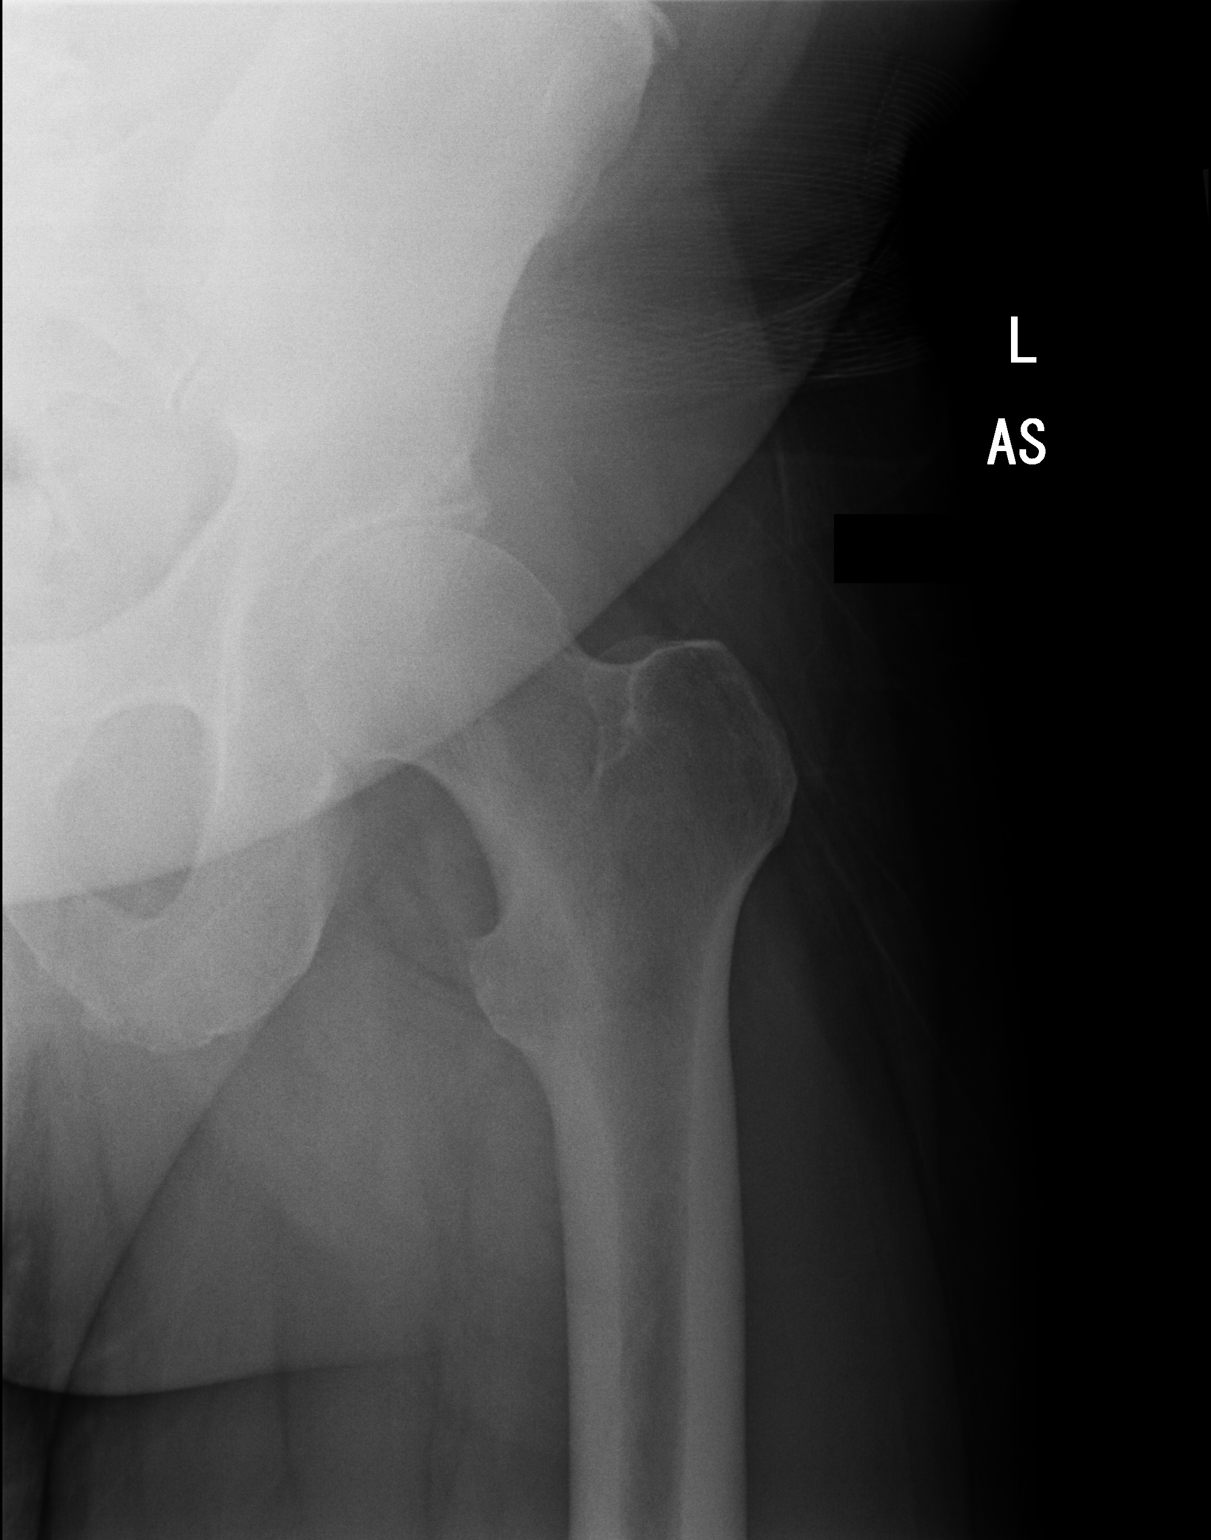

[dg hip unilat w or w/o pelvis 2-3 views  (2 of 2)]
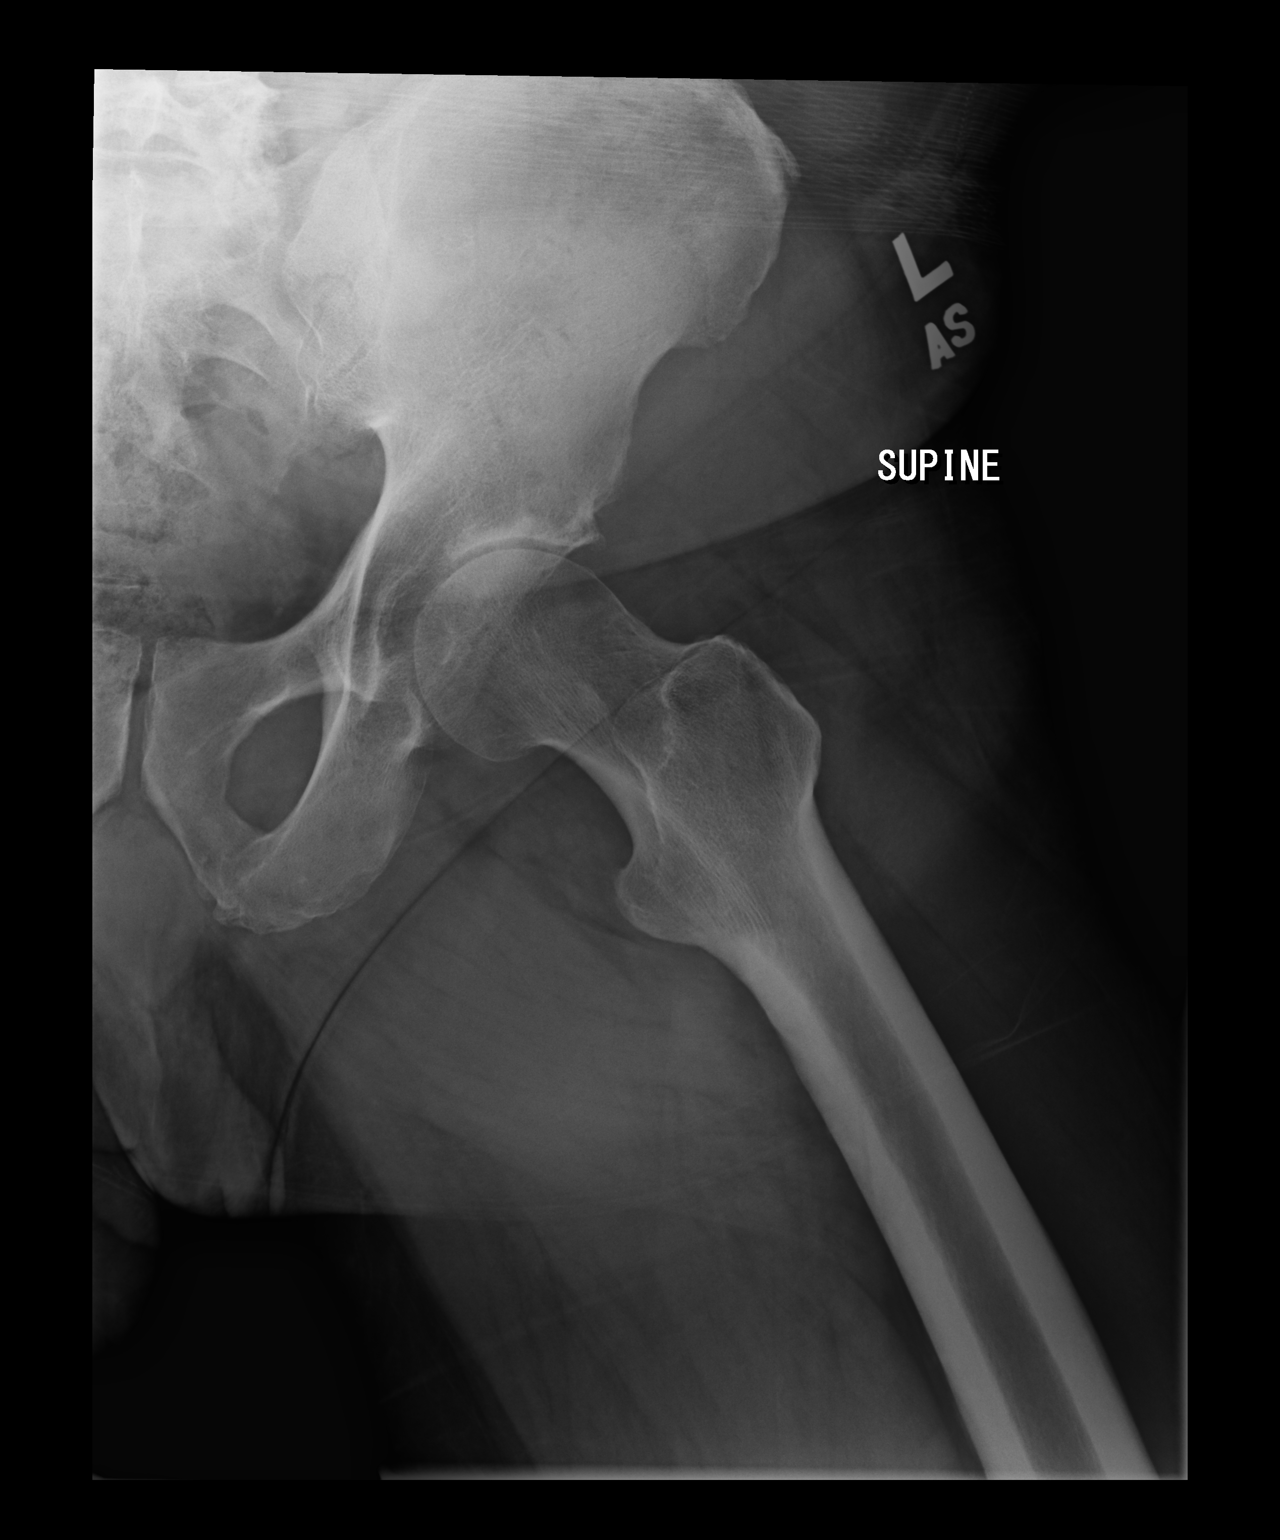

[2 of 2 positions shown; findings below may reference images not displayed]

FINDINGS: There is no evidence of hip fracture or dislocation. There is mild
osteoarthritis of the left hip.
IMPRESSION: No acute osseous injury of the left hip.

## 2019-06-15 IMAGING — RF DG HIP (WITH PELVIS) OPERATIVE*R*
1 series · 2 of 2 positions shown · non-contrast
Comparison: None.

CLINICAL DATA: Intraoperative imaging for left hip replacement.

EXAM:
DG C-ARM 61-120 MIN; OPERATIVE RIGHT HIP WITH PELVIS

[Series 1: run · 2 of 2 slices shown]
[im 1/2]
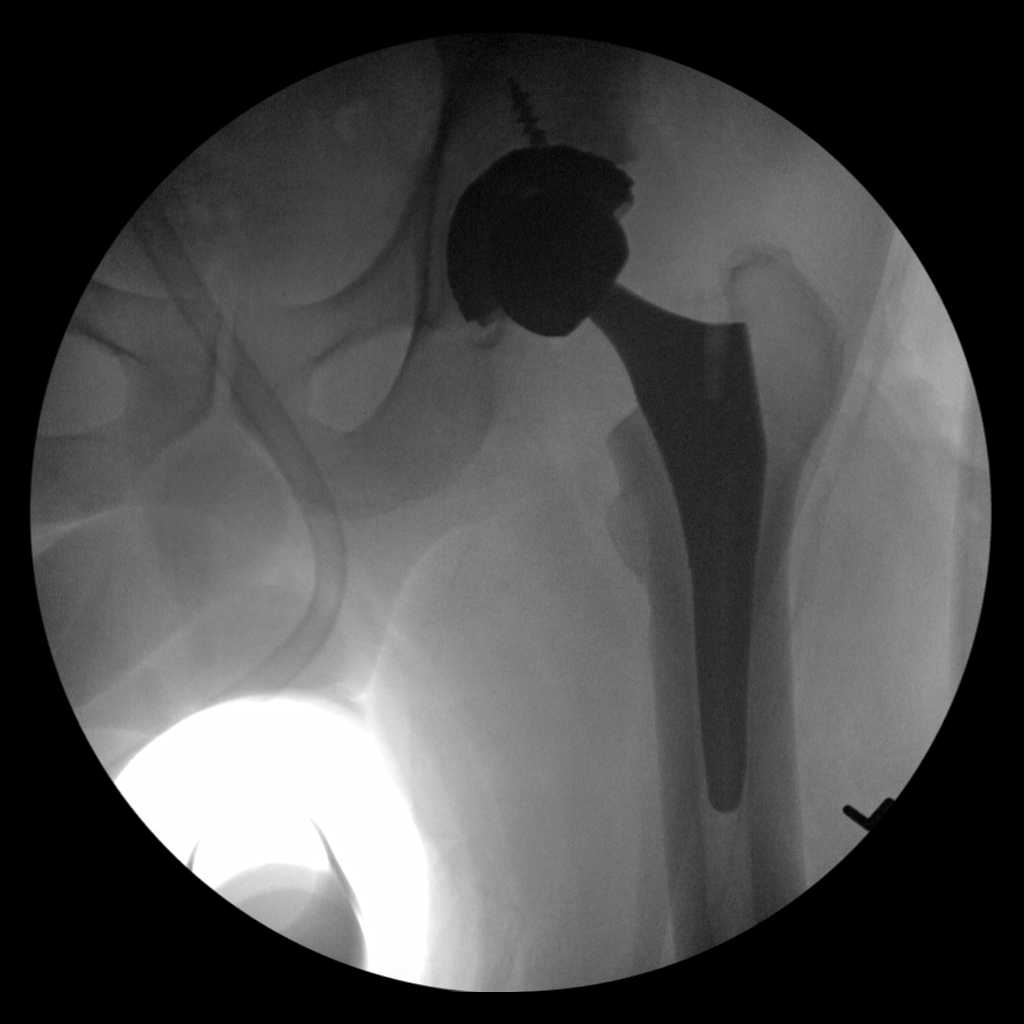
[im 2/2]
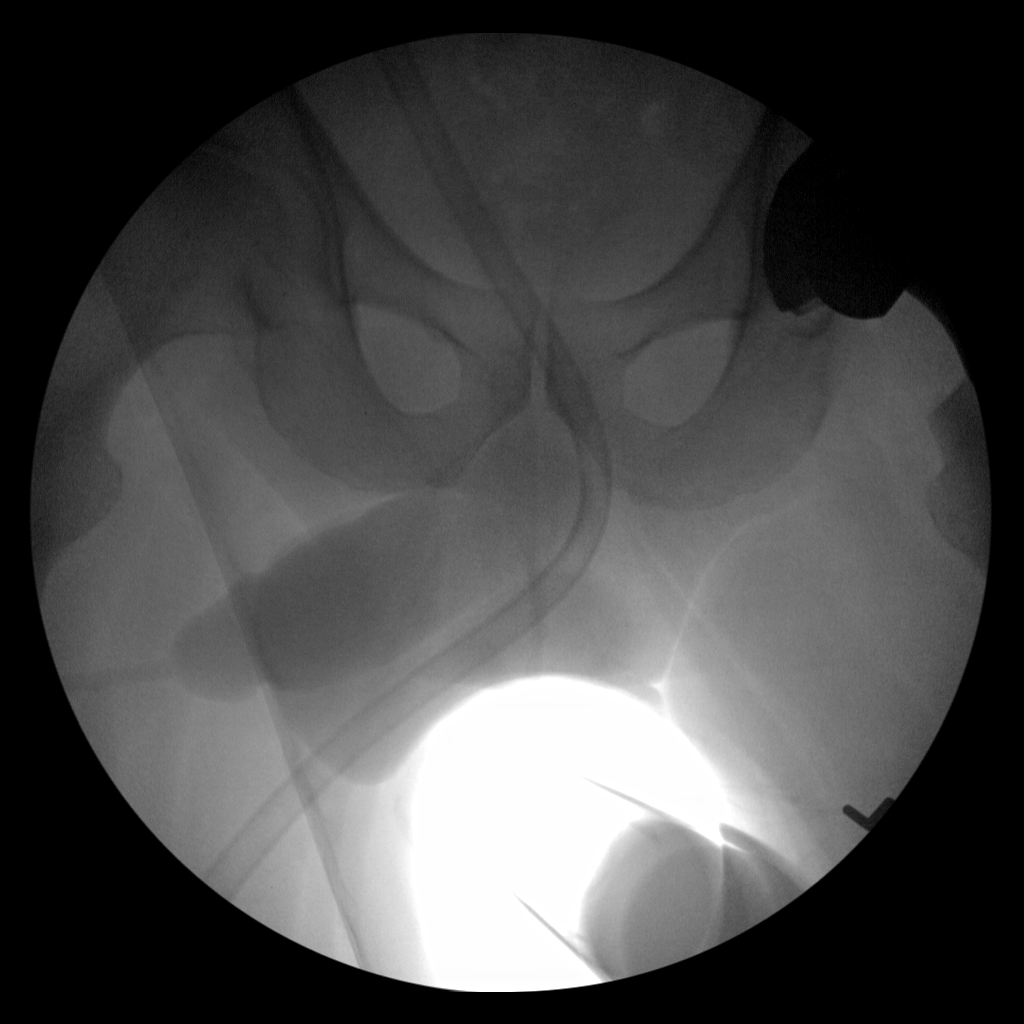

[2 of 2 positions shown; findings below may reference images not displayed]

FINDINGS: Two fluoroscopic spot views are provided of the low pelvis and left
hip. Images demonstrate a left total hip arthroplasty in place. No
fracture is identified. The device is located.
IMPRESSION: Left hip replacement without acute abnormality.
